# Patient Record
Sex: Female | Born: 1980 | Hispanic: Yes | Marital: Married | Smoking: Never smoker
Health system: Southern US, Community
[De-identification: ages and names within clinical notes are randomized; demographics above are authoritative.]

## PROBLEM LIST (undated history)

## (undated) DIAGNOSIS — R7303 Prediabetes: Secondary | ICD-10-CM

## (undated) HISTORY — PX: TUBAL LIGATION: SHX77

---

## 2007-02-15 ENCOUNTER — Emergency Department: Payer: Self-pay

## 2007-02-17 ENCOUNTER — Ambulatory Visit: Payer: Self-pay

## 2007-02-22 ENCOUNTER — Ambulatory Visit: Payer: Self-pay

## 2014-01-24 ENCOUNTER — Ambulatory Visit: Payer: Self-pay | Admitting: Advanced Practice Midwife

## 2014-04-16 ENCOUNTER — Ambulatory Visit: Payer: Self-pay | Admitting: Advanced Practice Midwife

## 2018-02-17 DIAGNOSIS — R51 Headache: Secondary | ICD-10-CM | POA: Diagnosis not present

## 2018-02-17 DIAGNOSIS — R7309 Other abnormal glucose: Secondary | ICD-10-CM | POA: Diagnosis not present

## 2018-02-17 DIAGNOSIS — R42 Dizziness and giddiness: Secondary | ICD-10-CM | POA: Diagnosis not present

## 2018-04-04 DIAGNOSIS — R7303 Prediabetes: Secondary | ICD-10-CM | POA: Diagnosis not present

## 2018-04-04 DIAGNOSIS — Z0001 Encounter for general adult medical examination with abnormal findings: Secondary | ICD-10-CM | POA: Diagnosis not present

## 2018-05-03 DIAGNOSIS — Z01419 Encounter for gynecological examination (general) (routine) without abnormal findings: Secondary | ICD-10-CM | POA: Diagnosis not present

## 2018-05-08 DIAGNOSIS — S6991XA Unspecified injury of right wrist, hand and finger(s), initial encounter: Secondary | ICD-10-CM | POA: Diagnosis not present

## 2018-05-08 DIAGNOSIS — M79644 Pain in right finger(s): Secondary | ICD-10-CM | POA: Diagnosis not present

## 2018-05-08 DIAGNOSIS — M722 Plantar fascial fibromatosis: Secondary | ICD-10-CM | POA: Diagnosis not present

## 2019-05-09 ENCOUNTER — Ambulatory Visit: Payer: Self-pay | Admitting: General Surgery

## 2019-05-09 NOTE — H&P (Signed)
PATIENT PROFILE: Donna Mcdaniel is a 38 y.o. female who presents to the Clinic for consultation at the request of Dr. Edwina Barth for evaluation of right inguinal hernia.  PCP:  Velna Ochs, MD  HISTORY OF PRESENT ILLNESS: Ms. Donna Mcdaniel reports having a right inguinal hernia.  She reports that she has had it for more than 10 years.  Initially was a small knot.  But in the last year he has been getting bigger.  She reports that it can be reduced easily.  She reports pains when the hernia is out.  Pain does not radiate to other part of body.  Pain is aggravated by walking large distances.  Alleviating factor is reducing the hernia.  Patient does not take medication.  Denies abdominal distention.  Denies nausea or vomiting.  Denies constipation.   PROBLEM LIST:         Problem List  Date Reviewed: 04/08/2019         Noted   Prediabetes 04/04/2018      GENERAL REVIEW OF SYSTEMS:   General ROS: negative for - chills, fatigue, fever, weight gain or weight loss Allergy and Immunology ROS: negative for - hives  Hematological and Lymphatic ROS: negative for - bleeding problems or bruising, negative for palpable nodes Endocrine ROS: negative for - heat or cold intolerance, hair changes Respiratory ROS: negative for - cough, shortness of breath or wheezing Cardiovascular ROS: no chest pain or palpitations GI ROS: negative for nausea, vomiting, abdominal pain, diarrhea, constipation Musculoskeletal ROS: negative for - joint swelling or muscle pain Neurological ROS: negative for - confusion, syncope Dermatological ROS: negative for pruritus and rash Psychiatric: negative for anxiety, depression, difficulty sleeping and memory loss  MEDICATIONS: Medication history reviewed and patient reports she does not take any medications.  ALLERGIES: Patient has no known allergies.  PAST MEDICAL HISTORY: History reviewed. No pertinent past medical history.  PAST SURGICAL HISTORY:      Past Surgical History:  Procedure Laterality Date  . TUBAL LIGATION  2015     FAMILY HISTORY:      Family History  Problem Relation Age of Onset  . No Known Problems Mother   . Heart disease Father   . High blood pressure (Hypertension) Father      SOCIAL HISTORY: Social History          Socioeconomic History  . Marital status: Married    Spouse name: Raynelle Chary  . Number of children: 3  . Years of education: Not on file  . Highest education level: Not on file  Occupational History  . Occupation: Merchant navy officer  Social Needs  . Financial resource strain: Not on file  . Food insecurity:    Worry: Not on file    Inability: Not on file  . Transportation needs:    Medical: Not on file    Non-medical: Not on file  Tobacco Use  . Smoking status: Never Smoker  . Smokeless tobacco: Never Used  Substance and Sexual Activity  . Alcohol use: Not Currently  . Drug use: Not Currently  . Sexual activity: Yes    Partners: Male    Birth control/protection: Surgical  Other Topics Concern  . Not on file  Social History Narrative  . Not on file      PHYSICAL EXAM:    Vitals:   05/09/19 1007  BP: 100/62  Pulse: 74   Body mass index is 25.15 kg/m. Weight: 64.4 kg (142 lb)   GENERAL: Alert, active, oriented x3  HEENT:  Pupils equal reactive to light. Extraocular movements are intact. Sclera clear. Palpebral conjunctiva normal red color.Pharynx clear.  NECK: Supple with no palpable mass and no adenopathy.  LUNGS: Sound clear with no rales rhonchi or wheezes.  HEART: Regular rhythm S1 and S2 without murmur.  ABDOMEN: Soft and depressible, nontender with no palpable mass, no hepatomegaly.  Right groin hernia, reducible, minimally tender upon reduction.  EXTREMITIES: Well-developed well-nourished symmetrical with no dependent edema.  NEUROLOGICAL: Awake alert oriented, facial expression symmetrical, moving all extremities.  REVIEW OF  DATA: I have reviewed the following data today:      Appointment on 04/01/2019  Component Date Value  . Glucose 04/01/2019 91   . Sodium 04/01/2019 138   . Potassium 04/01/2019 4.3   . Chloride 04/01/2019 103   . Carbon Dioxide (CO2) 04/01/2019 30.0   . Urea Nitrogen (BUN) 04/01/2019 13   . Creatinine 04/01/2019 0.7   . Glomerular Filtration Ra* 04/01/2019 94   . Calcium 04/01/2019 9.4   . AST  04/01/2019 72*  . ALT  04/01/2019 37   . Alk Phos (alkaline Phosp* 04/01/2019 57   . Albumin 04/01/2019 4.3   . Bilirubin, Total 04/01/2019 0.5   . Protein, Total 04/01/2019 6.8   . A/G Ratio 04/01/2019 1.7   . WBC (White Blood Cell Co* 04/01/2019 6.4   . RBC (Red Blood Cell Coun* 04/01/2019 4.55   . Hemoglobin 04/01/2019 14.1   . Hematocrit 04/01/2019 41.3   . MCV (Mean Corpuscular Vo* 04/01/2019 90.8   . MCH (Mean Corpuscular He* 04/01/2019 31.0   . MCHC (Mean Corpuscular H* 04/01/2019 34.1   . Platelet Count 04/01/2019 194   . RDW-CV (Red Cell Distrib* 04/01/2019 13.1   . MPV (Mean Platelet Volum* 04/01/2019 12.4   . Neutrophils 04/01/2019 3.56   . Lymphocytes 04/01/2019 2.20   . Monocytes 04/01/2019 0.49   . Eosinophils 04/01/2019 0.08   . Basophils 04/01/2019 0.03   . Neutrophil % 04/01/2019 55.8   . Lymphocyte % 04/01/2019 34.5   . Monocyte % 04/01/2019 7.7   . Eosinophil % 04/01/2019 1.3   . Basophil% 04/01/2019 0.5   . Immature Granulocyte % 04/01/2019 0.2   . Immature Granulocyte Cou* 04/01/2019 0.01   . Hemoglobin A1C 04/01/2019 5.6   . Average Blood Glucose (C* 04/01/2019 114   . Color 04/01/2019 Yellow   . Clarity 04/01/2019 Clear   . Specific Gravity 04/01/2019 1.015   . pH, Urine 04/01/2019 8.0   . Protein, Urinalysis 04/01/2019 Negative   . Glucose, Urinalysis 04/01/2019 Negative   . Ketones, Urinalysis 04/01/2019 Negative   . Blood, Urinalysis 04/01/2019 Small*  . Nitrite, Urinalysis 04/01/2019 Negative   . Leukocyte Esterase, Urin* 04/01/2019 Trace*  .  White Blood Cells, Urina* 04/01/2019 0-3   . Red Blood Cells, Urinaly* 04/01/2019 0-3   . Bacteria, Urinalysis 04/01/2019 Rare*  . Squamous Epithelial Cell* 04/01/2019 Few      ASSESSMENT: Ms. Donna Mcdaniel is a 38 y.o. female presenting for consultation for right inguinal hernia.    The patient presents with a symptomatic, reducible inguinal hernia. Patient was oriented about the diagnosis of inguinal hernia and its implication. The patient was oriented about the treatment alternatives (observation vs surgical repair). Due to patient symptoms, repair is recommended. Patient oriented about the surgical procedure, the use of mesh and its risk of complications such as: infection, bleeding, injury inguinal canal structures, vasculature, injury to bowel or bladder, and chronic pain.  Non-recurrent unilateral inguinal hernia without  obstruction or gangrene [K40.90]  PLAN: 1. Laparoscopic right inguinal hernia repair with mesh (50354) 2. CBC, CMP 3. Avoid taking aspirin 5 days before surgery 4. Contact us if has any question or concern  Patient verbalized understanding, all questions were answered, and were agreeable with the plan outlined above.    Herbert Pun, MD  Electronically signed by Herbert Pun, MD

## 2019-05-09 NOTE — H&P (View-Only) (Signed)
PATIENT PROFILE: Donna Mcdaniel is a 38 y.o. female who presents to the Clinic for consultation at the request of Dr. Edwina Barth for evaluation of right inguinal hernia.  PCP:  Velna Ochs, MD  HISTORY OF PRESENT ILLNESS: Ms. Donna Mcdaniel reports having a right inguinal hernia.  She reports that she has had it for more than 10 years.  Initially was a small knot.  But in the last year he has been getting bigger.  She reports that it can be reduced easily.  She reports pains when the hernia is out.  Pain does not radiate to other part of body.  Pain is aggravated by walking large distances.  Alleviating factor is reducing the hernia.  Patient does not take medication.  Denies abdominal distention.  Denies nausea or vomiting.  Denies constipation.   PROBLEM LIST:         Problem List  Date Reviewed: 04/08/2019         Noted   Prediabetes 04/04/2018      GENERAL REVIEW OF SYSTEMS:   General ROS: negative for - chills, fatigue, fever, weight gain or weight loss Allergy and Immunology ROS: negative for - hives  Hematological and Lymphatic ROS: negative for - bleeding problems or bruising, negative for palpable nodes Endocrine ROS: negative for - heat or cold intolerance, hair changes Respiratory ROS: negative for - cough, shortness of breath or wheezing Cardiovascular ROS: no chest pain or palpitations GI ROS: negative for nausea, vomiting, abdominal pain, diarrhea, constipation Musculoskeletal ROS: negative for - joint swelling or muscle pain Neurological ROS: negative for - confusion, syncope Dermatological ROS: negative for pruritus and rash Psychiatric: negative for anxiety, depression, difficulty sleeping and memory loss  MEDICATIONS: Medication history reviewed and patient reports she does not take any medications.  ALLERGIES: Patient has no known allergies.  PAST MEDICAL HISTORY: History reviewed. No pertinent past medical history.  PAST SURGICAL HISTORY:      Past Surgical History:  Procedure Laterality Date  . TUBAL LIGATION  2015     FAMILY HISTORY:      Family History  Problem Relation Age of Onset  . No Known Problems Mother   . Heart disease Father   . High blood pressure (Hypertension) Father      SOCIAL HISTORY: Social History          Socioeconomic History  . Marital status: Married    Spouse name: Raynelle Chary  . Number of children: 3  . Years of education: Not on file  . Highest education level: Not on file  Occupational History  . Occupation: Merchant navy officer  Social Needs  . Financial resource strain: Not on file  . Food insecurity:    Worry: Not on file    Inability: Not on file  . Transportation needs:    Medical: Not on file    Non-medical: Not on file  Tobacco Use  . Smoking status: Never Smoker  . Smokeless tobacco: Never Used  Substance and Sexual Activity  . Alcohol use: Not Currently  . Drug use: Not Currently  . Sexual activity: Yes    Partners: Male    Birth control/protection: Surgical  Other Topics Concern  . Not on file  Social History Narrative  . Not on file      PHYSICAL EXAM:    Vitals:   05/09/19 1007  BP: 100/62  Pulse: 74   Body mass index is 25.15 kg/m. Weight: 64.4 kg (142 lb)   GENERAL: Alert, active, oriented x3  HEENT:  Pupils equal reactive to light. Extraocular movements are intact. Sclera clear. Palpebral conjunctiva normal red color.Pharynx clear.  NECK: Supple with no palpable mass and no adenopathy.  LUNGS: Sound clear with no rales rhonchi or wheezes.  HEART: Regular rhythm S1 and S2 without murmur.  ABDOMEN: Soft and depressible, nontender with no palpable mass, no hepatomegaly.  Right groin hernia, reducible, minimally tender upon reduction.  EXTREMITIES: Well-developed well-nourished symmetrical with no dependent edema.  NEUROLOGICAL: Awake alert oriented, facial expression symmetrical, moving all extremities.  REVIEW OF  DATA: I have reviewed the following data today:      Appointment on 04/01/2019  Component Date Value  . Glucose 04/01/2019 91   . Sodium 04/01/2019 138   . Potassium 04/01/2019 4.3   . Chloride 04/01/2019 103   . Carbon Dioxide (CO2) 04/01/2019 30.0   . Urea Nitrogen (BUN) 04/01/2019 13   . Creatinine 04/01/2019 0.7   . Glomerular Filtration Ra* 04/01/2019 94   . Calcium 04/01/2019 9.4   . AST  04/01/2019 72*  . ALT  04/01/2019 37   . Alk Phos (alkaline Phosp* 04/01/2019 57   . Albumin 04/01/2019 4.3   . Bilirubin, Total 04/01/2019 0.5   . Protein, Total 04/01/2019 6.8   . A/G Ratio 04/01/2019 1.7   . WBC (White Blood Cell Co* 04/01/2019 6.4   . RBC (Red Blood Cell Coun* 04/01/2019 4.55   . Hemoglobin 04/01/2019 14.1   . Hematocrit 04/01/2019 41.3   . MCV (Mean Corpuscular Vo* 04/01/2019 90.8   . MCH (Mean Corpuscular He* 04/01/2019 31.0   . MCHC (Mean Corpuscular H* 04/01/2019 34.1   . Platelet Count 04/01/2019 194   . RDW-CV (Red Cell Distrib* 04/01/2019 13.1   . MPV (Mean Platelet Volum* 04/01/2019 12.4   . Neutrophils 04/01/2019 3.56   . Lymphocytes 04/01/2019 2.20   . Monocytes 04/01/2019 0.49   . Eosinophils 04/01/2019 0.08   . Basophils 04/01/2019 0.03   . Neutrophil % 04/01/2019 55.8   . Lymphocyte % 04/01/2019 34.5   . Monocyte % 04/01/2019 7.7   . Eosinophil % 04/01/2019 1.3   . Basophil% 04/01/2019 0.5   . Immature Granulocyte % 04/01/2019 0.2   . Immature Granulocyte Cou* 04/01/2019 0.01   . Hemoglobin A1C 04/01/2019 5.6   . Average Blood Glucose (C* 04/01/2019 114   . Color 04/01/2019 Yellow   . Clarity 04/01/2019 Clear   . Specific Gravity 04/01/2019 1.015   . pH, Urine 04/01/2019 8.0   . Protein, Urinalysis 04/01/2019 Negative   . Glucose, Urinalysis 04/01/2019 Negative   . Ketones, Urinalysis 04/01/2019 Negative   . Blood, Urinalysis 04/01/2019 Small*  . Nitrite, Urinalysis 04/01/2019 Negative   . Leukocyte Esterase, Urin* 04/01/2019 Trace*  .  White Blood Cells, Urina* 04/01/2019 0-3   . Red Blood Cells, Urinaly* 04/01/2019 0-3   . Bacteria, Urinalysis 04/01/2019 Rare*  . Squamous Epithelial Cell* 04/01/2019 Few      ASSESSMENT: Ms. Donna Mcdaniel is a 38 y.o. female presenting for consultation for right inguinal hernia.    The patient presents with a symptomatic, reducible inguinal hernia. Patient was oriented about the diagnosis of inguinal hernia and its implication. The patient was oriented about the treatment alternatives (observation vs surgical repair). Due to patient symptoms, repair is recommended. Patient oriented about the surgical procedure, the use of mesh and its risk of complications such as: infection, bleeding, injury inguinal canal structures, vasculature, injury to bowel or bladder, and chronic pain.  Non-recurrent unilateral inguinal hernia without   obstruction or gangrene [K40.90]  PLAN: 1. Laparoscopic right inguinal hernia repair with mesh (50354) 2. CBC, CMP 3. Avoid taking aspirin 5 days before surgery 4. Contact us if has any question or concern  Patient verbalized understanding, all questions were answered, and were agreeable with the plan outlined above.    Herbert Pun, MD  Electronically signed by Herbert Pun, MD

## 2019-05-20 ENCOUNTER — Encounter
Admission: RE | Admit: 2019-05-20 | Discharge: 2019-05-20 | Disposition: A | Discharge status: Home / Self Care | Payer: 59 | Source: Ambulatory Visit | Attending: General Surgery | Admitting: General Surgery

## 2019-05-20 ENCOUNTER — Other Ambulatory Visit: Payer: Self-pay

## 2019-05-20 HISTORY — DX: Prediabetes: R73.03

## 2019-05-20 NOTE — Patient Instructions (Signed)
Your procedure is scheduled on: 05-24-19 FRIDAY Su procedimiento est programado para: Report to [facility] 2ND FLOOR Presntese a: To find out your arrival time please call [phone] between 1PM - 3PM on 05-23-19 THURSDAY Para saber su hora de llegada por favor llame al [phone] entre la 1PM - 3PM el da:   Remember: Instructions that are not followed completely may result in serious medical risk, up to and including death,  or upon the discretion of your surgeon and anesthesiologist your surgery may need to be rescheduled.  Recuerde: Las instrucciones que no se siguen completamente Armed forces logistics/support/administrative officerpueden resultar en un riesgo de salud grave, incluyendo hasta  la [place]te o a discrecin de su cirujano y Scientific laboratory techniciananestesilogo, su ciruga se puede posponer.   __X_ 1.Do not eat food after midnight the night before your procedure. No    gum chewing or hard candies. You may drink clear liquids up to 2 hours     before you are scheduled to arrive for your surgery- DO not drink clear     Liquids within 2 hours of the start of your surgery.     Clear Liquids include:    water, apple juice without pulp, clear carbohydrate drink such as    Clearfast of Gartorade, Black Coffee or Tea (Do not add anything to coffee or tea).      No coma nada despus de la medianoche de la noche anterior a su    procedimiento. No coma chicles ni caramelos duros. Puede tomar    lquidos claros hasta 2 horas antes de su hora programada de llegada al     hospital para su procedimiento. No tome lquidos claros durante el     transcurso de las 2 horas de su llegada programada al hospital para su     procedimiento, ya que esto puede llevar a que su procedimiento se    retrase o tenga que volver a Magazine features editorprogramarse.  Los lquidos claros incluyen:          - Agua o jugo de Font[place]amanzana sin pulpa          - Bebidas claras con carbohidratos como ClearFast o Gatorade          - Caf negro o t claro (sin leche, sin cremas, no agregue  nada al caf ni al t)  No tome nada que no est en esta lista.  Los pacientes con diabetes tipo 1 y tipo 2 solo deben Printmakertomar agua.  Llame a la clnica de PreCare o a la unidad de Same Day Surgery si  tiene alguna pregunta sobre estas instrucciones.              ___ 2.Do Not Smoke or use e-cigarettes For 24 Hours Prior to Your Surgery.    Do not use any chewable tobacco products for at least 6   hours prior to surgery.    No fume ni use cigarrillos electrnicos durante las 24 horas previas    a su [place].  No use ningn producto de tabaco masticable durante   al menos 6 horas antes de la [place].     __X_ 3. No alcohol for 24 hours before or after surgery.    No tome alcohol durante las 24 horas antes ni despus de la [place].   ____4. Bring all medications with you on the day of surgery if instructed.    Lleve todos los medicamentos con usted el da de su ciruga si se le  ha indicado as.   _X___ 5. Notify your doctor if there is any change in your medical condition (cold,fever, infections).    Informe a su mdico si hay algn cambio en su condicin mdica  (resfriado, fiebre, infecciones).   Do not wear jewelry, make-up, hairpins, clips or nail polish.  No use joyas, maquillajes, pinzas/ganchos para el cabello ni esmalte de uas.  Do not wear lotions, powders, or perfumes. You may wear deodorant.  No use lociones, polvos o perfumes.  Puede usar desodorante.    Do not shave 48 hours prior to surgery. Men may shave face and neck.  No se afeite 48 horas antes de la ciruga.  Los hombres pueden Commercial Metals Companyafeitarse la cara  y el cuello.   Do not bring valuables to the hospital.   No lleve objetos de valor al [place]hospital.  [facility] any belongings or valuables.  [place] no se hace responsable de ningn tipo de pertenencias u objetos de Licensed conveyancervalor.               Contacts, dentures or bridgework may not be worn into surgery.  Los lentes de [place]contacto, las dentaduras  postizas o puentes no se pueden usar en la [place].   Leave your suitcase in the car. After surgery it may be brought to your room.  Deje su maleta en el auto.  Despus de la ciruga podr traerla a su habitacin.   For patients admitted to the hospital, discharge time is determined by your  treatment team.  Para los pacientes que sean ingresados al hospital, el tiempo en el cual se le  dar de alta es determinado por su equipo de [place].   Patients discharged the day of surgery will not be allowed to drive home. A los pacientes que se les da de alta el mismo da de la ciruga no se les permitir conducir a Higher education careers advisercasa.   Please read over the following fact sheets that you were given: Por favor lea las siguientes hojas de informacin que le dieron:     ____ Take these medicines the morning of surgery with A SIP OF WATER:          [place]ens-Illinoisome estas medicinas la maana de la ciruga con UN SORBO DE AGUA:  1.NONE  2.   3.   4.       5.  6.  ____ Fleet Enema (as directed)          Enema de Fleet (segn lo indicado)    _X___ Use CHG Soap as directed          Utilice el jabn de CHG segn lo indicado  ____ Use inhalers on the day of surgery          Use los inhaladores el da de la ciruga  ____ Stop metformin 2 days prior to surgery          Deje de tomar el metformin 2 das antes de la ciruga    ____ Take 1/2 of usual insulin dose the night before surgery and none on the morning of surgery           Tome la mitad de la dosis habitual de insulina la noche antes de la [place] y no tome nada en la maana de la             ciruga  ____ Stop Coumadin/Plavix/aspirin on           Deje de tomar el Coumadin/Plavix/aspirina  el da:  _X___ Stop Anti-inflammatories NOW-DO NOT TAKE ADVIL, IBUPROFEN, ALEVE  NAPROXEN OR ANYTHING WITH ASPIRIN IN IT. OK TO TAKE TYLENOL IF  NEEDED          Deje de tomar antiinflamatorios el da:   ____ Stop supplements until after surgery            Deje de tomar  suplementos hasta despus de la ciruga  ____ Bring C-Pap to the hospital          [place] al hospital

## 2019-05-21 ENCOUNTER — Other Ambulatory Visit
Admission: RE | Admit: 2019-05-21 | Discharge: 2019-05-21 | Disposition: A | Discharge status: Home / Self Care | Payer: 59 | Source: Ambulatory Visit | Attending: General Surgery | Admitting: General Surgery

## 2019-05-21 ENCOUNTER — Other Ambulatory Visit: Payer: Self-pay

## 2019-05-21 DIAGNOSIS — Z20828 Contact with and (suspected) exposure to other viral communicable diseases: Secondary | ICD-10-CM | POA: Insufficient documentation

## 2019-05-21 DIAGNOSIS — Z01812 Encounter for preprocedural laboratory examination: Secondary | ICD-10-CM | POA: Diagnosis present

## 2019-05-21 LAB — SARS CORONAVIRUS 2 (TAT 6-24 HRS): SARS Coronavirus 2: NEGATIVE

## 2019-05-23 ENCOUNTER — Other Ambulatory Visit: Payer: Self-pay

## 2019-05-24 ENCOUNTER — Ambulatory Visit: Payer: No Typology Code available for payment source | Admitting: Certified Registered"

## 2019-05-24 ENCOUNTER — Ambulatory Visit
Admission: RE | Admit: 2019-05-24 | Discharge: 2019-05-24 | Disposition: A | Discharge status: Home / Self Care | Payer: No Typology Code available for payment source | Attending: General Surgery | Admitting: General Surgery

## 2019-05-24 ENCOUNTER — Encounter: Payer: Self-pay | Admitting: *Deleted

## 2019-05-24 ENCOUNTER — Encounter
Admission: RE | Disposition: A | Discharge status: Home / Self Care | Payer: Self-pay | Source: Home / Self Care | Attending: General Surgery

## 2019-05-24 ENCOUNTER — Other Ambulatory Visit: Payer: Self-pay

## 2019-05-24 DIAGNOSIS — R7303 Prediabetes: Secondary | ICD-10-CM | POA: Insufficient documentation

## 2019-05-24 DIAGNOSIS — K4090 Unilateral inguinal hernia, without obstruction or gangrene, not specified as recurrent: Secondary | ICD-10-CM | POA: Insufficient documentation

## 2019-05-24 HISTORY — PX: INGUINAL HERNIA REPAIR: SHX194

## 2019-05-24 LAB — GLUCOSE, CAPILLARY
Glucose-Capillary: 89 mg/dL (ref 70–99)
Glucose-Capillary: 107 mg/dL — ABNORMAL HIGH (ref 70–99)

## 2019-05-24 LAB — POCT PREGNANCY, URINE: Preg Test, Ur: NEGATIVE

## 2019-05-24 SURGERY — REPAIR, HERNIA, INGUINAL, LAPAROSCOPIC
Anesthesia: General | Laterality: Right

## 2019-05-24 MED ORDER — PHENYLEPHRINE HCL (PRESSORS) 10 MG/ML IV SOLN
INTRAVENOUS | Status: DC | PRN
  Administered 2019-05-24: 100 ug via INTRAVENOUS
  Administered 2019-05-24: 200 ug via INTRAVENOUS
  Administered 2019-05-24 (×3): 100 ug via INTRAVENOUS

## 2019-05-24 MED ORDER — PROPOFOL 10 MG/ML IV BOLUS
INTRAVENOUS | Status: DC | PRN
  Administered 2019-05-24: 50 mg via INTRAVENOUS
  Administered 2019-05-24: 150 mg via INTRAVENOUS

## 2019-05-24 MED ORDER — LACTATED RINGERS IV SOLN
INTRAVENOUS | Status: DC
  Administered 2019-05-24 (×2): via INTRAVENOUS

## 2019-05-24 MED ORDER — MIDAZOLAM HCL 2 MG/2ML IJ SOLN
INTRAMUSCULAR | Status: AC
  Filled 2019-05-24: qty 2

## 2019-05-24 MED ORDER — KETAMINE HCL 50 MG/ML IJ SOLN
INTRAMUSCULAR | Status: DC | PRN
  Administered 2019-05-24: 25 mg via INTRAVENOUS

## 2019-05-24 MED ORDER — HYDROCODONE-ACETAMINOPHEN 5-325 MG PO TABS: 1.0000 | ORAL_TABLET | ORAL | 0 refills | Status: AC | PRN

## 2019-05-24 MED ORDER — SUCCINYLCHOLINE CHLORIDE 20 MG/ML IJ SOLN
INTRAMUSCULAR | Status: AC
  Filled 2019-05-24: qty 1

## 2019-05-24 MED ORDER — SUCCINYLCHOLINE CHLORIDE 20 MG/ML IJ SOLN
INTRAMUSCULAR | Status: DC | PRN
  Administered 2019-05-24: 100 mg via INTRAVENOUS

## 2019-05-24 MED ORDER — ONDANSETRON HCL 4 MG/2ML IJ SOLN
INTRAMUSCULAR | Status: AC
  Filled 2019-05-24: qty 2

## 2019-05-24 MED ORDER — DEXAMETHASONE SODIUM PHOSPHATE 10 MG/ML IJ SOLN
INTRAMUSCULAR | Status: DC | PRN
  Administered 2019-05-24: 10 mg via INTRAVENOUS

## 2019-05-24 MED ORDER — GLYCOPYRROLATE 0.2 MG/ML IJ SOLN
INTRAMUSCULAR | Status: AC
  Filled 2019-05-24: qty 1

## 2019-05-24 MED ORDER — FENTANYL CITRATE (PF) 100 MCG/2ML IJ SOLN
INTRAMUSCULAR | Status: AC
  Administered 2019-05-24: 12:00:00 25 ug via INTRAVENOUS
  Filled 2019-05-24: qty 2

## 2019-05-24 MED ORDER — SUGAMMADEX SODIUM 200 MG/2ML IV SOLN
INTRAVENOUS | Status: DC | PRN
  Administered 2019-05-24: 150 mg via INTRAVENOUS

## 2019-05-24 MED ORDER — LIDOCAINE HCL (PF) 2 % IJ SOLN
INTRAMUSCULAR | Status: AC
  Filled 2019-05-24: qty 10

## 2019-05-24 MED ORDER — CEFAZOLIN SODIUM-DEXTROSE 2-4 GM/100ML-% IV SOLN
INTRAVENOUS | Status: AC
  Filled 2019-05-24: qty 100

## 2019-05-24 MED ORDER — ONDANSETRON HCL 4 MG/2ML IJ SOLN
INTRAMUSCULAR | Status: DC | PRN
  Administered 2019-05-24: 4 mg via INTRAVENOUS

## 2019-05-24 MED ORDER — LIDOCAINE HCL (CARDIAC) PF 100 MG/5ML IV SOSY
PREFILLED_SYRINGE | INTRAVENOUS | Status: DC | PRN
  Administered 2019-05-24: 50 mg via INTRAVENOUS

## 2019-05-24 MED ORDER — DEXAMETHASONE SODIUM PHOSPHATE 10 MG/ML IJ SOLN
INTRAMUSCULAR | Status: AC
  Filled 2019-05-24: qty 1

## 2019-05-24 MED ORDER — FENTANYL CITRATE (PF) 100 MCG/2ML IJ SOLN
INTRAMUSCULAR | Status: AC
  Filled 2019-05-24: qty 2

## 2019-05-24 MED ORDER — PROPOFOL 10 MG/ML IV BOLUS
INTRAVENOUS | Status: AC
  Filled 2019-05-24: qty 20

## 2019-05-24 MED ORDER — CEFAZOLIN SODIUM-DEXTROSE 2-4 GM/100ML-% IV SOLN
2.0000 g | INTRAVENOUS | Status: AC
  Administered 2019-05-24: 09:00:00 2 g via INTRAVENOUS

## 2019-05-24 MED ORDER — ONDANSETRON HCL 4 MG/2ML IJ SOLN: 4.0000 mg | Freq: Once | INTRAMUSCULAR | Status: DC | PRN

## 2019-05-24 MED ORDER — ROCURONIUM BROMIDE 50 MG/5ML IV SOLN
INTRAVENOUS | Status: AC
  Filled 2019-05-24: qty 1

## 2019-05-24 MED ORDER — FAMOTIDINE 20 MG PO TABS
ORAL_TABLET | ORAL | Status: AC
  Administered 2019-05-24: 09:00:00 20 mg via ORAL
  Filled 2019-05-24: qty 1

## 2019-05-24 MED ORDER — GLYCOPYRROLATE 0.2 MG/ML IJ SOLN
INTRAMUSCULAR | Status: DC | PRN
  Administered 2019-05-24: 0.2 mg via INTRAVENOUS

## 2019-05-24 MED ORDER — MIDAZOLAM HCL 2 MG/2ML IJ SOLN
INTRAMUSCULAR | Status: DC | PRN
  Administered 2019-05-24: 2 mg via INTRAVENOUS

## 2019-05-24 MED ORDER — FAMOTIDINE 20 MG PO TABS
20.0000 mg | ORAL_TABLET | Freq: Once | ORAL | Status: AC
  Administered 2019-05-24: 09:00:00 20 mg via ORAL

## 2019-05-24 MED ORDER — SUGAMMADEX SODIUM 200 MG/2ML IV SOLN
INTRAVENOUS | Status: AC
  Filled 2019-05-24: qty 2

## 2019-05-24 MED ORDER — ACETAMINOPHEN 10 MG/ML IV SOLN
INTRAVENOUS | Status: AC
  Filled 2019-05-24: qty 100

## 2019-05-24 MED ORDER — FENTANYL CITRATE (PF) 100 MCG/2ML IJ SOLN
INTRAMUSCULAR | Status: DC | PRN
  Administered 2019-05-24 (×2): 50 ug via INTRAVENOUS

## 2019-05-24 MED ORDER — BUPIVACAINE-EPINEPHRINE (PF) 0.5% -1:200000 IJ SOLN
INTRAMUSCULAR | Status: DC | PRN
  Administered 2019-05-24: 10 mL

## 2019-05-24 MED ORDER — ROCURONIUM BROMIDE 100 MG/10ML IV SOLN
INTRAVENOUS | Status: DC | PRN
  Administered 2019-05-24: 10 mg via INTRAVENOUS
  Administered 2019-05-24: 5 mg via INTRAVENOUS
  Administered 2019-05-24: 10 mg via INTRAVENOUS
  Administered 2019-05-24: 20 mg via INTRAVENOUS
  Administered 2019-05-24: 35 mg via INTRAVENOUS

## 2019-05-24 MED ORDER — ACETAMINOPHEN 10 MG/ML IV SOLN
INTRAVENOUS | Status: DC | PRN
  Administered 2019-05-24: 1000 mg via INTRAVENOUS

## 2019-05-24 MED ORDER — FENTANYL CITRATE (PF) 100 MCG/2ML IJ SOLN
25.0000 ug | INTRAMUSCULAR | Status: DC | PRN
  Administered 2019-05-24 (×2): 25 ug via INTRAVENOUS

## 2019-05-24 SURGICAL SUPPLY — 55 items
APPLICATOR COTTON TIP 6 STRL (MISCELLANEOUS) IMPLANT
APPLICATOR COTTON TIP 6IN STRL (MISCELLANEOUS) ×3
BLADE CLIPPER SURG (BLADE) ×1 IMPLANT
BLADE SURG SZ11 CARB STEEL (BLADE) ×3 IMPLANT
CANISTER SUCT 1200ML W/VALVE (MISCELLANEOUS) ×3 IMPLANT
CHLORAPREP W/TINT 26 (MISCELLANEOUS) ×3 IMPLANT
COVER WAND RF STERILE (DRAPES) ×3 IMPLANT
DERMABOND ADVANCED (GAUZE/BANDAGES/DRESSINGS) ×2
DERMABOND ADVANCED .7 DNX12 (GAUZE/BANDAGES/DRESSINGS) ×1 IMPLANT
DEVICE SECURE STRAP 25 ABSORB (INSTRUMENTS) ×3 IMPLANT
DISSECT BALLN SPACEMKR OVL PDB (BALLOONS) ×3
DISSECT BALLN SPACEMKR OVL PRO (TROCAR) ×3
DISSECTOR BALLN SPCMKR OVL PDB (BALLOONS) ×1 IMPLANT
DISSECTOR BALLN SPCMKR OVL PRO (TROCAR) IMPLANT
ELECT REM PT RETURN 9FT ADLT (ELECTROSURGICAL) ×3
ELECTRODE REM PT RTRN 9FT ADLT (ELECTROSURGICAL) ×1 IMPLANT
ENDOLOOP SUT PDS II  0 18 (SUTURE) ×2
ENDOLOOP SUT PDS II 0 18 (SUTURE) IMPLANT
GAUZE SPONGE 4X4 12PLY STRL (GAUZE/BANDAGES/DRESSINGS) ×1 IMPLANT
GLOVE BIO SURGEON STRL SZ 6.5 (GLOVE) ×2 IMPLANT
GLOVE BIO SURGEONS STRL SZ 6.5 (GLOVE) ×1
GLOVE BIOGEL PI IND STRL 6.5 (GLOVE) ×1 IMPLANT
GLOVE BIOGEL PI INDICATOR 6.5 (GLOVE) ×2
GOWN STRL REUS W/ TWL LRG LVL3 (GOWN DISPOSABLE) ×2 IMPLANT
GOWN STRL REUS W/TWL LRG LVL3 (GOWN DISPOSABLE) ×4
IRRIGATION STRYKERFLOW (MISCELLANEOUS) IMPLANT
IRRIGATOR STRYKERFLOW (MISCELLANEOUS) ×3
IV NS 1000ML (IV SOLUTION) ×2
IV NS 1000ML BAXH (IV SOLUTION) IMPLANT
JELLY LUB 2OZ STRL (MISCELLANEOUS) ×2
JELLY LUBE 2OZ STRL (MISCELLANEOUS) ×1 IMPLANT
KIT TURNOVER KIT A (KITS) ×3 IMPLANT
KITTNER LAPARASCOPIC 5X40 (MISCELLANEOUS) ×3 IMPLANT
LABEL OR SOLS (LABEL) ×3 IMPLANT
MESH 3DMAX 4X6 RT LRG (Mesh General) ×2 IMPLANT
NDL FILTER BLUNT 18X1 1/2 (NEEDLE) ×1 IMPLANT
NDL HPO THNWL 1X22GA REG BVL (NEEDLE) ×1 IMPLANT
NEEDLE FILTER BLUNT 18X 1/2SAF (NEEDLE)
NEEDLE FILTER BLUNT 18X1 1/2 (NEEDLE) IMPLANT
NEEDLE SAFETY 22GX1 (NEEDLE) ×4
NS IRRIG 500ML POUR BTL (IV SOLUTION) ×3 IMPLANT
PACK LAP CHOLECYSTECTOMY (MISCELLANEOUS) ×3 IMPLANT
PENCIL ELECTRO HAND CTR (MISCELLANEOUS) ×3 IMPLANT
SCISSORS METZENBAUM CVD 33 (INSTRUMENTS) IMPLANT
SET TUBE SMOKE EVAC HIGH FLOW (TUBING) ×3 IMPLANT
SHEARS HARMONIC ACE PLUS 36CM (ENDOMECHANICALS) IMPLANT
SUT MNCRL 4-0 (SUTURE) ×2
SUT MNCRL 4-0 27XMFL (SUTURE) ×1
SUT MNCRL AB 4-0 PS2 18 (SUTURE) ×2 IMPLANT
SUT VICRYL 0 AB UR-6 (SUTURE) ×3 IMPLANT
SUTURE MNCRL 4-0 27XMF (SUTURE) IMPLANT
SYR 5ML LL (SYRINGE) ×1 IMPLANT
TRAY FOLEY MTR SLVR 16FR STAT (SET/KITS/TRAYS/PACK) ×3 IMPLANT
TROCAR 5MM SINGLE VERSAONE (TROCAR) ×2 IMPLANT
TROCAR BALLN 10M OMST10SB SPAC (TROCAR) ×1 IMPLANT

## 2019-05-24 NOTE — Interval H&P Note (Signed)
History and Physical Interval Note:  05/24/2019 8:52 AM  Donna Mcdaniel  has presented today for surgery, with the diagnosis of K40.90 NON RECURRENT UNILATERAL INQUINAL HERNIA W/O OBSTRUCTION OR GANGRENE.  The various methods of treatment have been discussed with the patient and family. After consideration of risks, benefits and other options for treatment, the patient has consented to  Procedure(s): LAPAROSCOPIC INGUINAL HERNIA RIGHT (Right) as a surgical intervention.  The patient's history has been reviewed, patient examined, no change in status, stable for surgery.  I have reviewed the patient's chart and labs.  Right sided marked in the pre procedure room. Questions were answered to the patient's satisfaction.     Herbert Pun

## 2019-05-24 NOTE — Anesthesia Post-op Follow-up Note (Signed)
Anesthesia QCDR form completed.        

## 2019-05-24 NOTE — Anesthesia Preprocedure Evaluation (Signed)
Anesthesia Evaluation  Patient identified by MRN, date of birth, ID band Patient awake    Reviewed: Allergy & Precautions, H&P , NPO status , Patient's Chart, lab work & pertinent test results, reviewed documented beta blocker date and time   Airway Mallampati: II  TM Distance: >3 FB Neck ROM: full    Dental  (+) Teeth Intact   Pulmonary neg pulmonary ROS,    Pulmonary exam normal        Cardiovascular Exercise Tolerance: Good negative cardio ROS Normal cardiovascular exam Rhythm:regular Rate:Normal     Neuro/Psych negative neurological ROS  negative psych ROS   GI/Hepatic negative GI ROS, Neg liver ROS,   Endo/Other  negative endocrine ROS  Renal/GU negative Renal ROS  negative genitourinary   Musculoskeletal   Abdominal   Peds  Hematology negative hematology ROS (+)   Anesthesia Other Findings Past Medical History: No date: Pre-diabetes Past Surgical History: No date: TUBAL LIGATION BMI    Body Mass Index: 24.76 kg/m     Reproductive/Obstetrics negative OB ROS                             Anesthesia Physical Anesthesia Plan  ASA: II  Anesthesia Plan: General ETT   Post-op Pain Management:    Induction:   PONV Risk Score and Plan:   Airway Management Planned:   Additional Equipment:   Intra-op Plan:   Post-operative Plan:   Informed Consent: I have reviewed the patients History and Physical, chart, labs and discussed the procedure including the risks, benefits and alternatives for the proposed anesthesia with the patient or authorized representative who has indicated his/her understanding and acceptance.     Dental Advisory Given  Plan Discussed with: CRNA  Anesthesia Plan Comments:         Anesthesia Quick Evaluation

## 2019-05-24 NOTE — Anesthesia Procedure Notes (Signed)
Procedure Name: Intubation Performed by: Rolla Plate, CRNA Pre-anesthesia Checklist: Patient identified, Patient being monitored, Timeout performed, Emergency Drugs available and Suction available Patient Re-evaluated:Patient Re-evaluated prior to induction Oxygen Delivery Method: Circle system utilized Preoxygenation: Pre-oxygenation with 100% oxygen Induction Type: IV induction Ventilation: Mask ventilation without difficulty Laryngoscope Size: 2 and Miller Grade View: Grade I Tube type: Oral Tube size: 7.0 mm Number of attempts: 1 Airway Equipment and Method: Stylet Placement Confirmation: ETT inserted through vocal cords under direct vision,  positive ETCO2 and breath sounds checked- equal and bilateral Secured at: 21 cm Tube secured with: Tape Dental Injury: Teeth and Oropharynx as per pre-operative assessment

## 2019-05-24 NOTE — Op Note (Signed)
Preoperative diagnosis: Right inguinal hernia.   Postoperative diagnosis: Right inguinal hernia.  Procedure: Transabdominal preperitoneal laparoscopic (TAPP) repair of right inguinal hernia.  Anesthesia: GETA  Surgeon: Dr. Windell Moment  Assistant Surgeon: Dr. Lysle Pearl  Wound Classification: Clean  Indications:  Patient is a 38 y.o. female developed a symptomatic right inguinal hernia. Repair was indicated.  Findings: 1. Indirect Inguinal hernia identified 2. Round ligament identified and preserved 3. Bard 3D Max mesh used for repair 4. TEP attempted but spacer went into abdominal cavity.  5. Adequate hemostasis.   Description of procedure: The patient was taken to the operating room and the correct side of surgery was verified. The patient was placed supine with arms tucked at the sides. After obtaining adequate anesthesia, the patient's abdomen was prepped and draped in standard sterile fashion. A time-out was completed verifying correct patient, procedure, site, positioning, and implant(s) and/or special equipment prior to beginning this procedure.  Periumbilical cutdown was done down to anterior fascia. The anterior fascia was incised. Blunt dissection with finger was done below the anterior fascia between the rectus muscle. A spacer was inserted and insufflated under direct visualization. After spacer removed, the scope was inserted and it was found to be in the abdominal cavity. It was decided to proceed with Trans abdominal pre peritoneal repair. Two 5-mm trocars were then placed lateral to the rectus sheath under direct visualization. Both inguinal regions were inspected and the median umbilical ligament, medial umbilical ligament, and lateral umbilical fold were identified.  The peritoneum was incised with endoscopic scissors along a line 2 cm above the superior edge of the hernia defect, extending from the median umbilical ligament to the anterior superior iliac spine. The peritoneal  flap was mobilized inferiorly using blunt and sharp dissection. The inferior epigastric vessels were exposed and the pubic symphysis was identified. Cooper's ligament was dissected to its junction with the iliac vein. The dissection was continued inferiorly to the iliopubic tract, with care taken to avoid injury to the femoral branch of the genitofemoral nerve and the lateral femoral cutaneous nerve. The round ligament was parietalized. The hernia was identified and reduced by gentle traction.  The indirect hernia sac was unable to be completely mobilized from the inguinal canal.  It was decided to divide the sac and it was closed with a PDS Endoloop.  Now the peritoneum seems to be reduced into the peritoneal cavity.  A large piece of mesh was rolled longitudinally into a compact cylinder and passed through a trocar. The cylinder was placed along the inferior aspect of the working space and unrolled into place to completely cover the direct, indirect, and femoral spaces. The mesh was secured into place superiorly to the anterior abdominal wall and inferiorly and medially to Cooper's ligament with absorbable tacks. Care was taken to avoid the inferolateral triangles containing the iliac vessels and genital nerves. The peritoneal flap was closed over the mesh and secured with tacks in similar positions of safety. After ensuring adequate hemostasis, the trocars were removed and the pneumoperitoneum allowed to escape. The trocar incisions were closed using monocryl and skin adhesive dressings applied.  The patient tolerated the procedure well and was taken to the postanesthesia care unit in stable condition.   Specimen: None  Complications: None  Estimated Blood Loss: 10 mL

## 2019-05-24 NOTE — Transfer of Care (Signed)
Immediate Anesthesia Transfer of Care Note  Patient: Donna Mcdaniel  Procedure(s) Performed: LAPAROSCOPIC INGUINAL HERNIA RIGHT (Right )  Patient Location: PACU  Anesthesia Type:General  Level of Consciousness: sedated  Airway & Oxygen Therapy: Patient Spontanous Breathing and Patient connected to face mask oxygen  Post-op Assessment: Report given to RN and Post -op Vital signs reviewed and stable  Post vital signs: Reviewed and stable  Last Vitals:  Vitals Value Taken Time  BP 105/59 05/24/19 1127  Temp    Pulse 66 05/24/19 1127  Resp 18 05/24/19 1127  SpO2 100 % 05/24/19 1127  Vitals shown include unvalidated device data.  Last Pain:  Vitals:   05/24/19 1127  TempSrc:   PainSc: 0-No pain         Complications: No apparent anesthesia complications

## 2019-05-24 NOTE — Progress Notes (Signed)
Pt BP maintaining 90's/60's. Last BP 96/60. Dr. Andree Elk notified. Stated pt ok to go home with that BP.

## 2019-05-24 NOTE — Discharge Instructions (Signed)
  Diet: Resume home heart healthy regular diet.   Activity: No heavy lifting >20 pounds (children, pets, laundry, garbage) or strenuous activity until follow-up, but light activity and walking are encouraged. Do not drive or drink alcohol if taking narcotic pain medications.  Wound care: May shower with soapy water and pat dry (do not rub incisions), but no baths or submerging incision underwater until follow-up. (no swimming)   Medications: Resume all home medications. For mild to moderate pain: acetaminophen (Tylenol) or ibuprofen (if no kidney disease). Combining Tylenol with alcohol can substantially increase your risk of causing liver disease. Narcotic pain medications, if prescribed, can be used for severe pain, though may cause nausea, constipation, and drowsiness. Do not combine Tylenol and Norco within a 6 hour period as Norco contains Tylenol. If you do not need the narcotic pain medication, you do not need to fill the prescription.  Call office ([phone]) at any time if any questions, worsening pain, fevers/chills, bleeding, drainage from incision site, or other concerns.   AMBULATORY SURGERY  DISCHARGE INSTRUCTIONS   1) The drugs that you were given will stay in your system until tomorrow so for the next 24 hours you should not:  A) Drive an automobile B) Make any legal decisions C) Drink any alcoholic beverage   2) You may resume regular meals tomorrow.  Today it is better to start with liquids and gradually work up to solid foods.  You may eat anything you prefer, but it is better to start with liquids, then soup and crackers, and gradually work up to solid foods.   3) Please notify your doctor immediately if you have any unusual bleeding, trouble breathing, redness and pain at the surgery site, drainage, fever, or pain not relieved by medication.    4) Additional Instructions:        Please contact your physician with any problems or Same Day Surgery at  [phone], Monday through Friday 6 am to 4 pm, or [facility] number at [phone]. 

## 2019-05-26 ENCOUNTER — Encounter: Payer: Self-pay | Admitting: General Surgery

## 2019-05-27 NOTE — Anesthesia Postprocedure Evaluation (Signed)
Anesthesia Post Note  Patient: Donna Mcdaniel  Procedure(s) Performed: LAPAROSCOPIC INGUINAL HERNIA RIGHT (Right )  Patient location during evaluation: PACU Anesthesia Type: General Level of consciousness: awake and alert Pain management: pain level controlled Vital Signs Assessment: post-procedure vital signs reviewed and stable Respiratory status: spontaneous breathing, nonlabored ventilation, respiratory function stable and patient connected to nasal cannula oxygen Cardiovascular status: blood pressure returned to baseline and stable Postop Assessment: no apparent nausea or vomiting Anesthetic complications: no     Last Vitals:  Vitals:   05/24/19 1311 05/24/19 1317  BP: (!) 97/54 98/69  Pulse: (!) 59   Resp: 16   Temp: 36.7 C   SpO2: 100% 100%    Last Pain:  Vitals:   05/24/19 1317  TempSrc: Temporal  PainSc:                  [name]

## 2019-12-14 ENCOUNTER — Ambulatory Visit: Payer: 59 | Attending: Internal Medicine

## 2019-12-14 DIAGNOSIS — Z23 Encounter for immunization: Secondary | ICD-10-CM

## 2019-12-14 NOTE — Progress Notes (Signed)
   Covid-19 Vaccination Clinic  Name:  Donna Mcdaniel    MRN: [id number] DOB: 10-Feb-1981  12/14/2019  Ms. Donna Mcdaniel was observed post Covid-19 immunization for 15 minutes without incident. She was provided with Vaccine Information Sheet and instruction to access the V-Safe system.   Ms. Donna Mcdaniel was instructed to call 911 with any severe reactions post vaccine: Marland Kitchen Difficulty breathing  . Swelling of face and throat  . A fast heartbeat  . A bad rash all over body  . Dizziness and weakness   Immunizations Administered    Name Date Dose VIS Date Route   Pfizer COVID-19 Vaccine 12/14/2019  7:52 PM 0.3 mL 09/06/2019 Intramuscular   Manufacturer: [employer], [employer]   Lot: [id number]   NDC: [address]-2

## 2020-01-04 ENCOUNTER — Ambulatory Visit: Payer: Self-pay | Attending: Internal Medicine

## 2020-01-04 DIAGNOSIS — Z23 Encounter for immunization: Secondary | ICD-10-CM

## 2020-01-04 NOTE — Progress Notes (Addendum)
   Covid-19 Vaccination Clinic  Name:  Donna Mcdaniel    MRN: [id number] DOB: 19-Feb-1981  01/04/2020  Donna Mcdaniel was observed post Covid-19 immunization for 15 minutes without incident. She was provided with Vaccine Information Sheet and instruction to access the V-Safe system. Medical interpreter used.  Donna Mcdaniel was instructed to call 911 with any severe reactions post vaccine: Marland Kitchen Difficulty breathing  . Swelling of face and throat  . A fast heartbeat  . A bad rash all over body  . Dizziness and weakness   Immunizations Administered    Name Date Dose VIS Date Route   Pfizer COVID-19 Vaccine 01/04/2020  6:08 PM 0.3 mL 09/06/2019 Intramuscular   Manufacturer: [employer], [employer]   Lot: [id number]   NDC: [address]-8

## 2020-04-28 ENCOUNTER — Other Ambulatory Visit: Payer: Self-pay

## 2020-04-28 ENCOUNTER — Encounter: Payer: Self-pay | Admitting: *Deleted

## 2020-04-28 ENCOUNTER — Emergency Department
Admission: EM | Admit: 2020-04-28 | Discharge: 2020-04-28 | Disposition: A | Discharge status: Home / Self Care | Payer: No Typology Code available for payment source | Attending: Emergency Medicine | Admitting: Emergency Medicine

## 2020-04-28 DIAGNOSIS — L292 Pruritus vulvae: Secondary | ICD-10-CM | POA: Diagnosis present

## 2020-04-28 DIAGNOSIS — A749 Chlamydial infection, unspecified: Secondary | ICD-10-CM | POA: Diagnosis not present

## 2020-04-28 DIAGNOSIS — Z202 Contact with and (suspected) exposure to infections with a predominantly sexual mode of transmission: Secondary | ICD-10-CM | POA: Diagnosis not present

## 2020-04-28 MED ORDER — AZITHROMYCIN 500 MG PO TABS
1000.0000 mg | ORAL_TABLET | Freq: Once | ORAL | Status: AC
  Administered 2020-04-28: 1000 mg via ORAL
  Filled 2020-04-28: qty 2

## 2020-04-28 NOTE — ED Notes (Signed)
Spanish interpreter via video Gillis Santa [phone] assisted with triage.

## 2020-04-28 NOTE — ED Triage Notes (Signed)
Patient is here to be treated for STD. Patient's husband has been tested.

## 2020-04-28 NOTE — ED Provider Notes (Signed)
[facility] Emergency Department Provider Note  ____________________________________________   First MD Initiated Contact with Patient 04/28/20 1806     (approximate)  I have reviewed the triage vital signs and the nursing notes.   HISTORY  Chief Complaint Vaginal Itching    HPI Donna Mcdaniel is a 39 y.o. female presents ED she would like to be treated.  She denies any fever, chills, or vaginal discharge.    Past Medical History:  Diagnosis Date  . Pre-diabetes     There are no problems to display for this patient.   Past Surgical History:  Procedure Laterality Date  . INGUINAL HERNIA REPAIR Right 05/24/2019   Procedure: LAPAROSCOPIC INGUINAL HERNIA RIGHT;  Surgeon: Carolan Shiver, MD;  Location: [facility] ORS;  Service: General;  Laterality: Right;  . TUBAL LIGATION      Prior to Admission medications   Not on File    Allergies Patient has no known allergies.  No family history on file.  Social History Social History   Tobacco Use  . Smoking status: Never Smoker  . Smokeless tobacco: Never Used  Vaping Use  . Vaping Use: Never used  Substance Use Topics  . Alcohol use: Never  . Drug use: Never    Review of Systems  Constitutional: No fever/chills Eyes: No visual changes. ENT: No sore throat. Respiratory: Denies cough Gastrointestinal: Denies abdominal pain Genitourinary: Negative for dysuria. Musculoskeletal: Negative for back pain. Skin: Negative for rash.    ____________________________________________   PHYSICAL EXAM:  VITAL SIGNS: ED Triage Vitals  Enc Vitals Group     BP 04/28/20 1803 (!) 131/91     Pulse Rate 04/28/20 1803 74     Resp 04/28/20 1803 16     Temp 04/28/20 1803 98.8 F (37.1 C)     Temp Source 04/28/20 1803 Oral     SpO2 04/28/20 1803 98 %     Weight 04/28/20 1806 137 lb (62.1 kg)     Height 04/28/20 1806 5\' 3"  (1.6 m)     Head Circumference --      Peak Flow --      Pain Score  04/28/20 1806 0     Pain Loc --      Pain Edu? --      Excl. in GC? --     Constitutional: Alert and oriented. Well appearing and in no acute distress. Eyes: Conjunctivae are normal.  Head: Atraumatic. Nose: No congestion/rhinnorhea. Mouth/Throat: Mucous membranes are moist.   Neck:  supple no lymphadenopathy noted Cardiovascular: Normal rate, regular rhythm. Respiratory: Normal respiratory effort.  No retractions,  GU: deferred by the patient Musculoskeletal: FROM all extremities, warm and well perfused Neurologic:  Normal speech and language.  Skin:  Skin is warm, dry and intact. No rash noted. Psychiatric: Mood and affect are normal. Speech and behavior are normal.  ____________________________________________   LABS (all labs ordered are listed, but only abnormal results are displayed)  Labs Reviewed - No data to display ____________________________________________   ____________________________________________  RADIOLOGY    ____________________________________________   PROCEDURES  Procedure(s) performed: No  Procedures    ____________________________________________   INITIAL IMPRESSION / ASSESSMENT AND PLAN / ED COURSE  Pertinent labs & imaging results that were available during my care of the patient were reviewed by me and considered in my medical decision making (see chart for details).   Patient is a 39 year old female presents emergency department with her husband.  Husband just tested positive for chlamydia while here  in my care.  Feel that it would be appropriate to go ahead and treat her.  She is denying any other symptoms.  Physical exam is unremarkable.  Pelvic was deferred by the patient  Patient was treated for chlamydia as her husband just tested positive for chlamydia.  Safe sex instructions were given.  They are to refrain from sex for 10 days.  Follow-up with the health department as needed.  Return if worsening.     Donna Mcdaniel was evaluated in Emergency Department on 04/28/2020 for the symptoms described in the history of present illness. She was evaluated in the context of the global COVID-19 pandemic, which necessitated consideration that the patient might be at risk for infection with the SARS-CoV-2 virus that causes COVID-19. Institutional protocols and algorithms that pertain to the evaluation of patients at risk for COVID-19 are in a state of rapid change based on information released by regulatory bodies including the CDC and federal and state organizations. These policies and algorithms were followed during the patient's care in the ED.    As part of my medical decision making, I reviewed the following data within the electronic MEDICAL RECORD NUMBER Nursing notes reviewed and incorporated, Old chart reviewed, Notes from prior ED visits and [place] Controlled Substance Database  ____________________________________________   FINAL CLINICAL IMPRESSION(S) / ED DIAGNOSES  Final diagnoses:  Exposure to STD  Chlamydia      NEW MEDICATIONS STARTED DURING THIS VISIT:  New Prescriptions   No medications on file     Note:  This document was prepared using Dragon voice recognition software and may include unintentional dictation errors.    Faythe Ghee, PA-C 04/28/20 Nancy Fetter, MD 04/28/20 2109

## 2020-04-28 NOTE — Discharge Instructions (Signed)
Follow-up with your regular doctor or the [facility] department You have been treated for chlamydia Do not have sex for at least 10 days Use condoms to prevent STDs

## 2020-12-08 ENCOUNTER — Emergency Department

## 2020-12-08 ENCOUNTER — Encounter: Payer: Self-pay | Admitting: Emergency Medicine

## 2020-12-08 ENCOUNTER — Emergency Department
Admission: EM | Admit: 2020-12-08 | Discharge: 2020-12-08 | Disposition: A | Discharge status: Home / Self Care | Attending: Emergency Medicine | Admitting: Emergency Medicine

## 2020-12-08 ENCOUNTER — Other Ambulatory Visit: Payer: Self-pay

## 2020-12-08 DIAGNOSIS — Y9263 Factory as the place of occurrence of the external cause: Secondary | ICD-10-CM | POA: Insufficient documentation

## 2020-12-08 DIAGNOSIS — T23252A Burn of second degree of left palm, initial encounter: Secondary | ICD-10-CM | POA: Diagnosis not present

## 2020-12-08 DIAGNOSIS — X19XXXA Contact with other heat and hot substances, initial encounter: Secondary | ICD-10-CM | POA: Insufficient documentation

## 2020-12-08 DIAGNOSIS — Y990 Civilian activity done for income or pay: Secondary | ICD-10-CM | POA: Diagnosis not present

## 2020-12-08 DIAGNOSIS — T23052A Burn of unspecified degree of left palm, initial encounter: Secondary | ICD-10-CM | POA: Diagnosis present

## 2020-12-08 MED ORDER — LIDOCAINE 3 % EX CREA: 1.0000 "application " | TOPICAL_CREAM | Freq: Two times a day (BID) | CUTANEOUS | 0 refills | Status: AC | PRN

## 2020-12-08 MED ORDER — SILVER SULFADIAZINE 1 % EX CREA: TOPICAL_CREAM | CUTANEOUS | 0 refills | Status: AC

## 2020-12-08 MED ORDER — SILVER SULFADIAZINE 1 % EX CREA
TOPICAL_CREAM | Freq: Once | CUTANEOUS | Status: AC
  Administered 2020-12-08: 1 via TOPICAL

## 2020-12-08 MED ORDER — FENTANYL CITRATE (PF) 100 MCG/2ML IJ SOLN
100.0000 ug | Freq: Once | INTRAMUSCULAR | Status: AC
  Administered 2020-12-08: 100 ug via INTRAMUSCULAR
  Filled 2020-12-08: qty 2

## 2020-12-08 MED ORDER — ONDANSETRON 8 MG PO TBDP
8.0000 mg | ORAL_TABLET | Freq: Once | ORAL | Status: AC
  Administered 2020-12-08: 8 mg via ORAL
  Filled 2020-12-08: qty 1

## 2020-12-08 MED ORDER — LIDOCAINE HCL URETHRAL/MUCOSAL 2 % EX GEL
1.0000 "application " | Freq: Once | CUTANEOUS | Status: AC
  Administered 2020-12-08: 1 via TOPICAL
  Filled 2020-12-08: qty 5

## 2020-12-08 MED ORDER — OXYCODONE-ACETAMINOPHEN 5-325 MG PO TABS: 1.0000 | ORAL_TABLET | Freq: Four times a day (QID) | ORAL | 0 refills | Status: AC | PRN

## 2020-12-08 NOTE — ED Notes (Signed)
See triage note.. presents with plastic to left hand  States she wa strying to get a scew from a machine

## 2020-12-08 NOTE — ED Provider Notes (Signed)
[facility] Emergency Department Provider Note  ____________________________________________  Time seen: Approximately 5:06 PM  I have reviewed the triage vital signs and the nursing notes.   HISTORY  Chief Complaint Burn    HPI Donna Mcdaniel is a 40 y.o. female who presents the emergency department complaining of a burn to the left hand.  Patient works at [employer] and was changing out the bolt when one of the machines.  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  Patient was changing the mold when the machine started to dispense molten plastic.  Patient had turned down the pressure and so this was not a significantly pressurized injury.  Patient had black mold and plastic here to her hand.  She states that she has been "picking" at the areas to remove the plastic.  Patient has a reported large vesicle to the palm of the hand.  No other injuries or complaints.         Past Medical History:  Diagnosis Date  . Pre-diabetes     There are no problems to display for this patient.   Past Surgical History:  Procedure Laterality Date  . INGUINAL HERNIA REPAIR Right 05/24/2019   Procedure: LAPAROSCOPIC INGUINAL HERNIA RIGHT;  Surgeon: Carolan Shiver, MD;  Location: [facility] ORS;  Service: General;  Laterality: Right;  . TUBAL LIGATION      Prior to Admission medications   Medication Sig Start Date End Date Taking? Authorizing Provider  Lidocaine 3 % CREA Apply 1 application topically 2 (two) times daily as needed (pain). 12/08/20  Yes Dutchess Crosland, Delorise Royals, PA-C  oxyCODONE-acetaminophen (PERCOCET/ROXICET) 5-325 MG tablet Take 1 tablet by mouth every 6 (six) hours as needed for severe pain. 12/08/20  Yes Patriece Archbold, Delorise Royals, PA-C  silver sulfADIAZINE (SILVADENE) 1 % cream Apply to affected area daily 12/08/20  Yes Cullin Dishman, Delorise Royals, PA-C    Allergies Patient has no known allergies.  No  family history on file.  Social History Social History   Tobacco Use  . Smoking status: Never Smoker  . Smokeless tobacco: Never Used  Vaping Use  . Vaping Use: Never used  Substance Use Topics  . Alcohol use: Never  . Drug use: Never     Review of Systems  Constitutional: No fever/chills Eyes: No visual changes. No discharge ENT: No upper respiratory complaints. Cardiovascular: no chest pain. Respiratory: no cough. No SOB. Gastrointestinal: No abdominal pain.  No nausea, no vomiting.  No diarrhea.  No constipation. Musculoskeletal: Burn to the left hand from molten plastic Skin: Negative for rash, abrasions, lacerations, ecchymosis. Neurological: Negative for headaches, focal weakness or numbness.  10 System ROS otherwise negative.  ____________________________________________   PHYSICAL EXAM:  VITAL SIGNS: ED Triage Vitals  Enc Vitals Group     BP 12/08/20 1552 (!) 146/101     Pulse Rate 12/08/20 1552 67     Resp 12/08/20 1552 16     Temp 12/08/20 1552 98.8 F (37.1 C)     Temp Source 12/08/20 1552 Oral     SpO2 12/08/20 1552 99 %     Weight 12/08/20 1620 136 lb 14.5 oz (62.1 kg)     Height 12/08/20 1620 5\' 3"  (1.6 m)     Head Circumference --      Peak Flow --      Pain Score --      Pain Loc --      Pain Edu? --  Excl. in GC? --      Constitutional: Alert and oriented. Well appearing and in no acute distress. Eyes: Conjunctivae are normal. PERRL. EOMI. Head: Atraumatic. ENT:      Ears:       Nose: No congestion/rhinnorhea.      Mouth/Throat: Mucous membranes are moist.  Neck: No stridor.    Cardiovascular: Normal rate, regular rhythm. Normal S1 and S2.  Good peripheral circulation. Respiratory: Normal respiratory effort without tachypnea or retractions. Lungs CTAB. Good air entry to the bases with no decreased or absent breath sounds. Musculoskeletal: Full range of motion to all extremities. No gross deformities appreciated.  Visualization of  the left hand reveals vesicles to the palm of the left hand consistent with second-degree burn.  There is no open wounds or lesions.  Vesicles intact.  Patient has no evidence of circumferential burns.  Is able to extend and flex the digits currently.  Sensation capillary refill intact.  Patient has few areas with a plastic still adhered to the external skin.  Majority of plastic has been removed by the patient prior to my assessment. Neurologic:  Normal speech and language. No gross focal neurologic deficits are appreciated.  Skin:  Skin is warm, dry and intact. No rash noted. Psychiatric: Mood and affect are normal. Speech and behavior are normal. Patient exhibits appropriate insight and judgement.   ____________________________________________   LABS (all labs ordered are listed, but only abnormal results are displayed)  Labs Reviewed - No data to display ____________________________________________  EKG   ____________________________________________  RADIOLOGY I personally viewed and evaluated these images as part of my medical decision making, as well as reviewing the written report by the radiologist.  ED Provider Interpretation: No evidence of foreign body in the soft tissues on x-ray.  DG Hand Complete Left  Result Date: 12/08/2020 CLINICAL DATA:  Burn to the palm of the left hand. EXAM: LEFT HAND - COMPLETE 3+ VIEW COMPARISON:  None. FINDINGS: There is no evidence of fracture or dislocation. There is no evidence of arthropathy or other focal bone abnormality. Soft tissues are unremarkable. IMPRESSION: Negative. Electronically Signed   By: Aram Candela M.D.   On: 12/08/2020 17:52    ____________________________________________    PROCEDURES  Procedure(s) performed:    Procedures    Medications  silver sulfADIAZINE (SILVADENE) 1 % cream (has no administration in time range)  lidocaine (XYLOCAINE) 2 % jelly 1 application (has no administration in time range)   fentaNYL (SUBLIMAZE) injection 100 mcg (100 mcg Intramuscular Given 12/08/20 1721)  ondansetron (ZOFRAN-ODT) disintegrating tablet 8 mg (8 mg Oral Given 12/08/20 1721)     ____________________________________________   INITIAL IMPRESSION / ASSESSMENT AND PLAN / ED COURSE  Pertinent labs & imaging results that were available during my care of the patient were reviewed by me and considered in my medical decision making (see chart for details).  Review of the [place] CSRS was performed in accordance of the [facility] prior to dispensing any controlled drugs.           Patient's diagnosis is consistent with same degree burn of the hand.  Patient presented to the emergency department after having molten plastic burned her left hand.  There is no penetration identified.  This was not under significant amount of pressure.  There does not appear to be any foreign body in the soft tissue of the hand.  Majority of plastic had been removed by patient prior to my assessment.  Large vesicle/bullae noted to the palm.  There is no rupture.  Sensation and capillary refill intact..  Patient will have Silvadene burn cream, topical lidocaine and prescription for pain medication.  Follow-up with [facility] burn outpatient tomorrow.  Patient is given ED precautions to return to the ED for any worsening or new symptoms.     ____________________________________________  FINAL CLINICAL IMPRESSION(S) / ED DIAGNOSES  Final diagnoses:  Partial thickness burn of palm of left hand, initial encounter      NEW MEDICATIONS STARTED DURING THIS VISIT:  ED Discharge Orders         Ordered    silver sulfADIAZINE (SILVADENE) 1 % cream        12/08/20 1826    Lidocaine 3 % CREA  2 times daily PRN        12/08/20 1826    oxyCODONE-acetaminophen (PERCOCET/ROXICET) 5-325 MG tablet  Every 6 hours PRN        12/08/20 1826              This chart was dictated using voice recognition software/Dragon. Despite best efforts to  proofread, errors can occur which can change the meaning. Any change was purely unintentional.    Racheal Patches, PA-C 12/08/20 1827    Concha Se, MD 12/09/20 [phone]

## 2020-12-08 NOTE — ED Triage Notes (Addendum)
Patient presents to the ED with a burn to her left hand from work at [employer].  Patient has black plastic melted to her left hand.  Patient states she works at [employer] and was taking off a screw and pressurized hot plastic came onto her hand.  Incident occurred approx. 1 hour ago.

## 2022-05-18 ENCOUNTER — Other Ambulatory Visit: Payer: Self-pay

## 2022-05-18 DIAGNOSIS — Z1231 Encounter for screening mammogram for malignant neoplasm of breast: Secondary | ICD-10-CM

## 2022-06-09 ENCOUNTER — Ambulatory Visit
Admission: RE | Admit: 2022-06-09 | Discharge: 2022-06-09 | Disposition: A | Discharge status: Home / Self Care | Payer: No Typology Code available for payment source | Source: Ambulatory Visit

## 2022-06-09 DIAGNOSIS — Z1231 Encounter for screening mammogram for malignant neoplasm of breast: Secondary | ICD-10-CM | POA: Insufficient documentation

## 2022-06-10 ENCOUNTER — Other Ambulatory Visit: Payer: Self-pay

## 2022-06-15 ENCOUNTER — Other Ambulatory Visit: Payer: Self-pay

## 2022-06-15 DIAGNOSIS — N6489 Other specified disorders of breast: Secondary | ICD-10-CM

## 2022-06-15 DIAGNOSIS — R928 Other abnormal and inconclusive findings on diagnostic imaging of breast: Secondary | ICD-10-CM

## 2022-07-06 ENCOUNTER — Ambulatory Visit
Admission: RE | Admit: 2022-07-06 | Discharge: 2022-07-06 | Disposition: A | Discharge status: Home / Self Care | Payer: No Typology Code available for payment source | Source: Ambulatory Visit

## 2022-07-06 DIAGNOSIS — N6489 Other specified disorders of breast: Secondary | ICD-10-CM | POA: Diagnosis present

## 2022-07-06 DIAGNOSIS — R928 Other abnormal and inconclusive findings on diagnostic imaging of breast: Secondary | ICD-10-CM | POA: Insufficient documentation

## 2022-07-07 ENCOUNTER — Ambulatory Visit (LOCAL_COMMUNITY_HEALTH_CENTER): Payer: No Typology Code available for payment source

## 2022-07-07 DIAGNOSIS — Z719 Counseling, unspecified: Secondary | ICD-10-CM

## 2022-07-07 DIAGNOSIS — Z23 Encounter for immunization: Secondary | ICD-10-CM | POA: Diagnosis not present

## 2022-07-07 NOTE — Progress Notes (Signed)
Patient provided paperwork for vaccines needed for immigration.  Rowland Lathe used for interpretation.   VIS given for vaccines. After vaccine care reviewed.  Copy of [facility] Provided.  Clayton Jarmon Shelda Pal, RN

## 2022-08-17 ENCOUNTER — Ambulatory Visit: Payer: No Typology Code available for payment source

## 2022-08-23 ENCOUNTER — Ambulatory Visit (LOCAL_COMMUNITY_HEALTH_CENTER): Payer: No Typology Code available for payment source

## 2022-08-23 ENCOUNTER — Ambulatory Visit: Payer: No Typology Code available for payment source

## 2022-08-23 DIAGNOSIS — Z23 Encounter for immunization: Secondary | ICD-10-CM

## 2022-08-23 DIAGNOSIS — Z719 Counseling, unspecified: Secondary | ICD-10-CM

## 2022-08-23 NOTE — Progress Notes (Signed)
Pt in clinic for 2nd dose of MMR and Varicella vaccines. Eligible and administered, tolerated well. M.Stephene Alegria, LPN.

## 2022-10-14 ENCOUNTER — Other Ambulatory Visit: Payer: Self-pay | Admitting: Internal Medicine

## 2022-10-14 DIAGNOSIS — R1011 Right upper quadrant pain: Secondary | ICD-10-CM

## 2022-10-14 DIAGNOSIS — R7303 Prediabetes: Secondary | ICD-10-CM

## 2022-10-21 ENCOUNTER — Ambulatory Visit
Admission: RE | Admit: 2022-10-21 | Discharge: 2022-10-21 | Disposition: A | Discharge status: Home / Self Care | Payer: No Typology Code available for payment source | Source: Ambulatory Visit | Attending: Internal Medicine | Admitting: Internal Medicine

## 2022-10-21 DIAGNOSIS — R1011 Right upper quadrant pain: Secondary | ICD-10-CM | POA: Insufficient documentation

## 2022-10-21 DIAGNOSIS — R7303 Prediabetes: Secondary | ICD-10-CM | POA: Diagnosis present

## 2022-11-01 IMAGING — CR DG HAND COMPLETE 3+V*L*
3 series · 3 of 3 positions shown · non-contrast
Comparison: None.

CLINICAL DATA: Burn to the palm of the left hand.

EXAM:
LEFT HAND - COMPLETE 3+ VIEW

[hand ap]
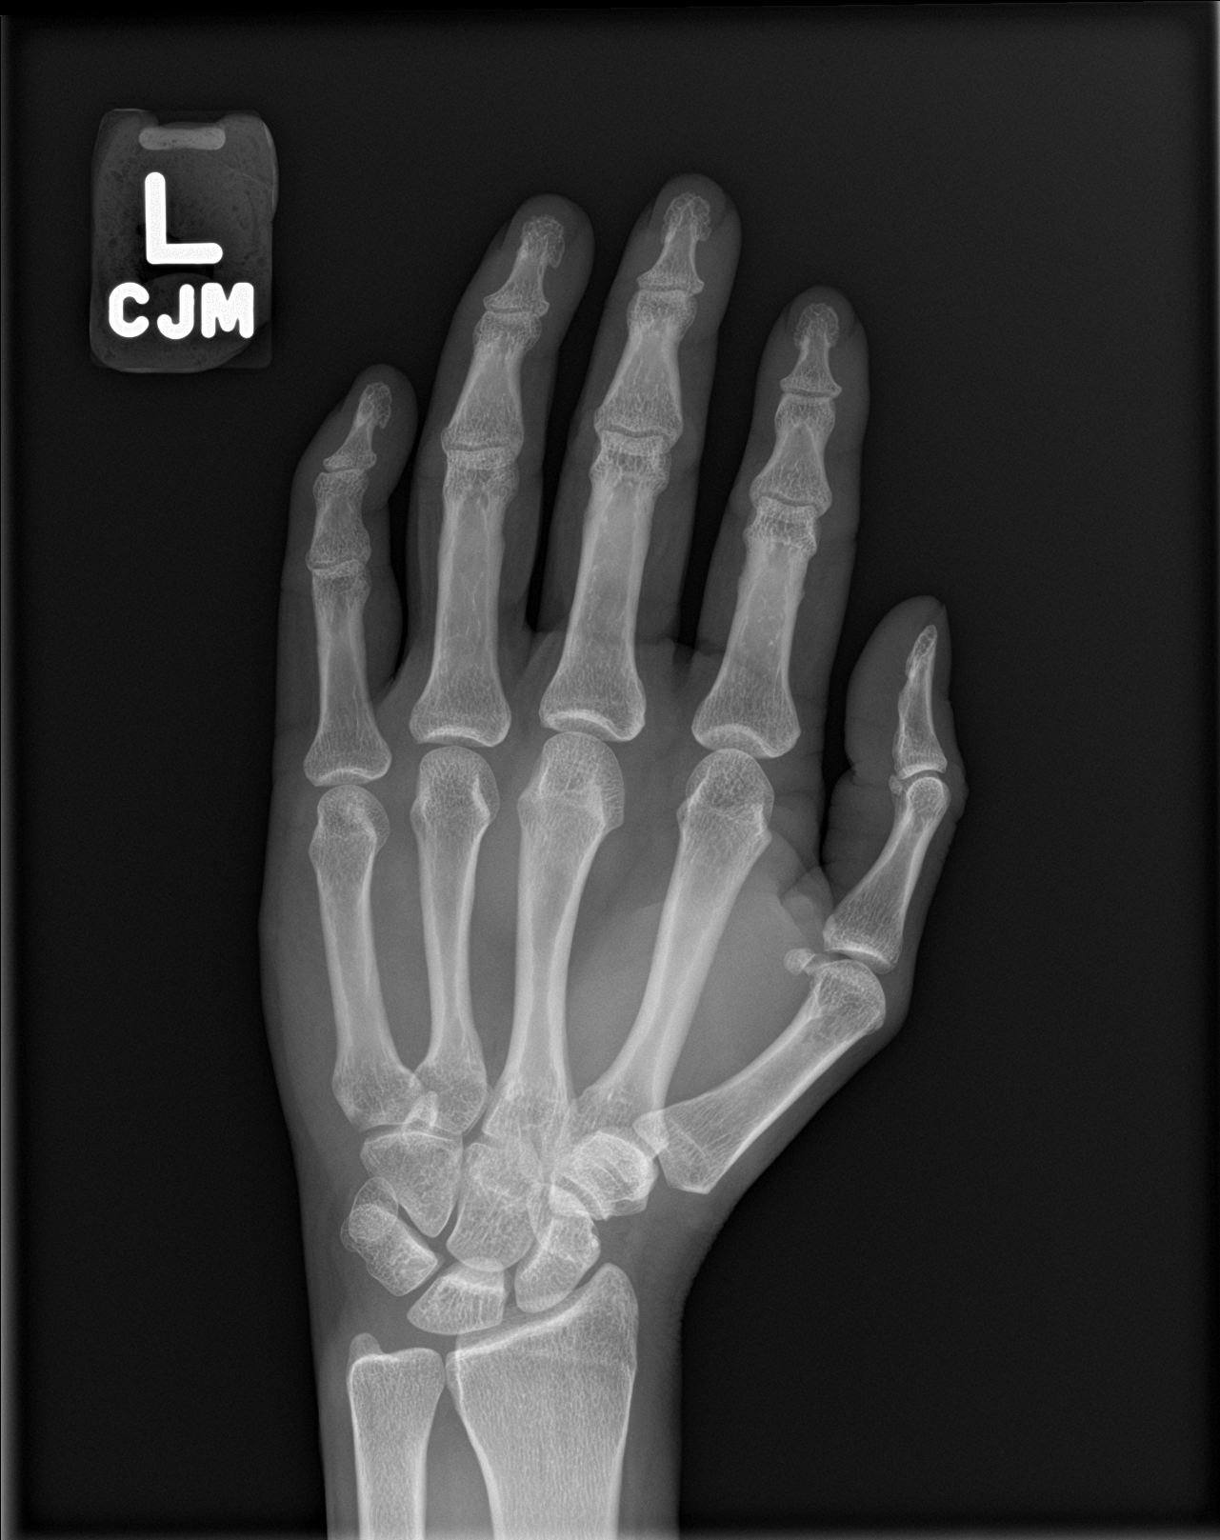

[hand obl]
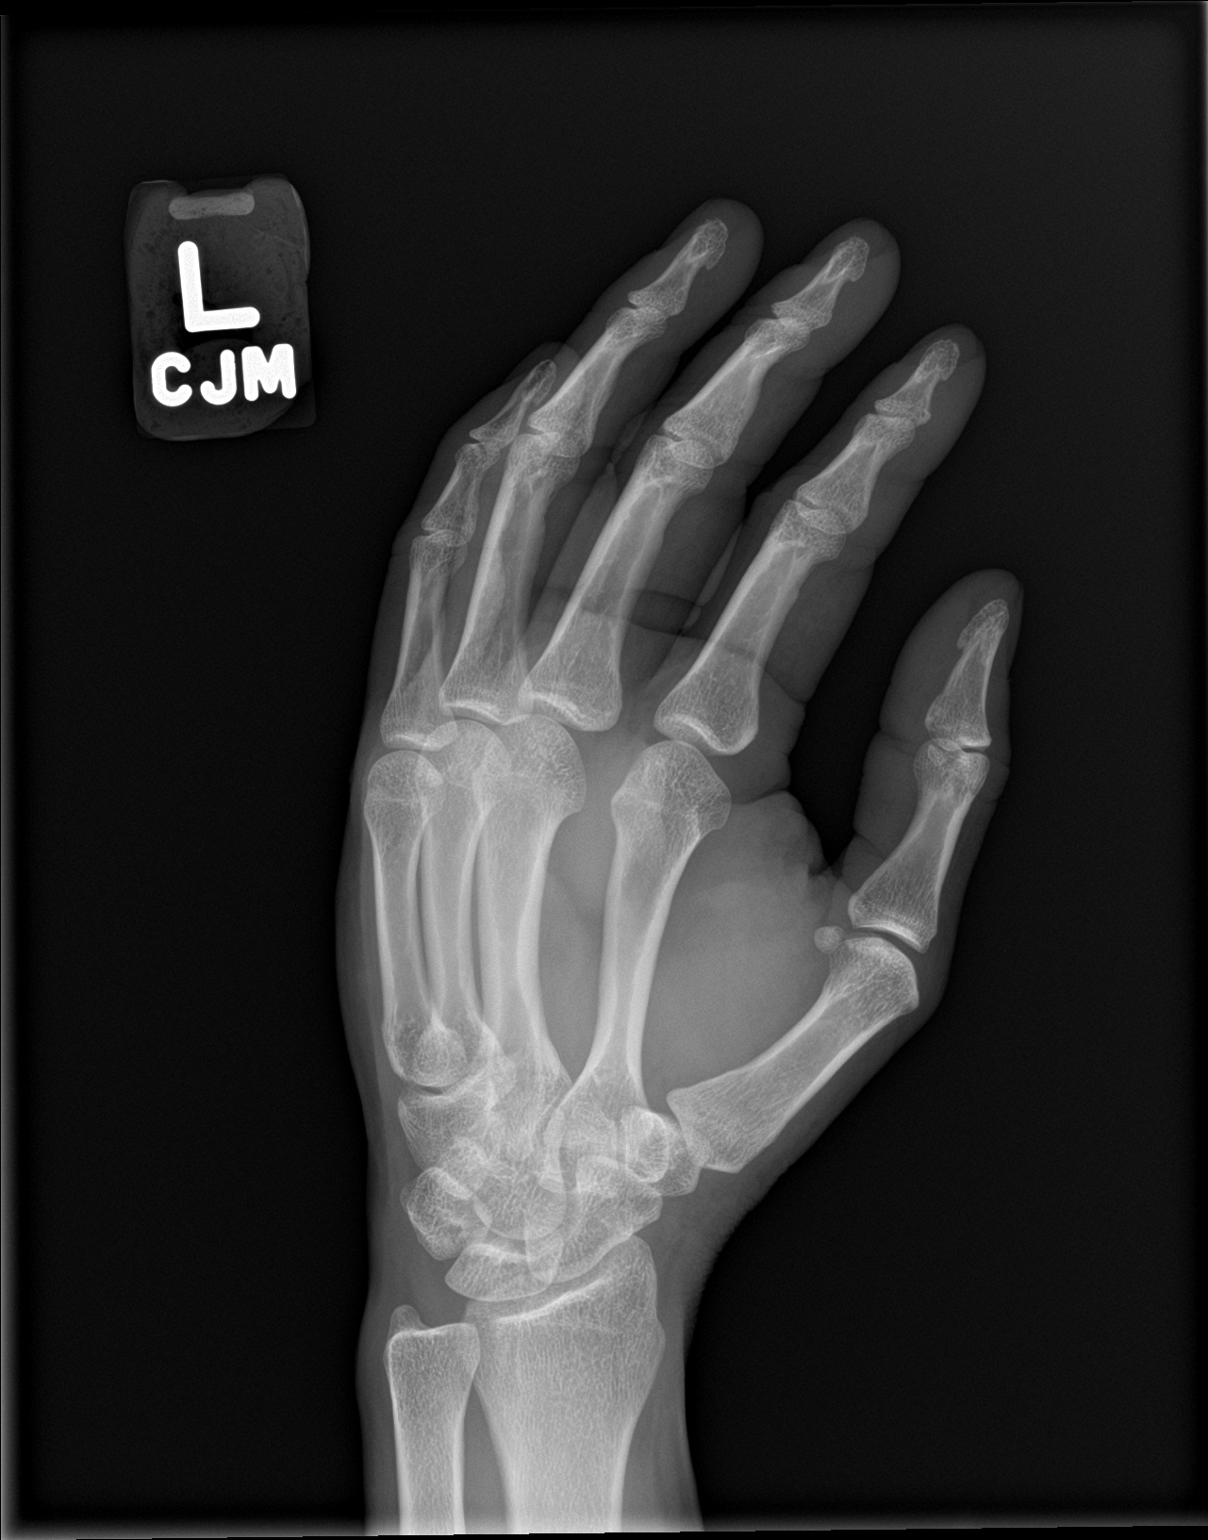

[hand lat]
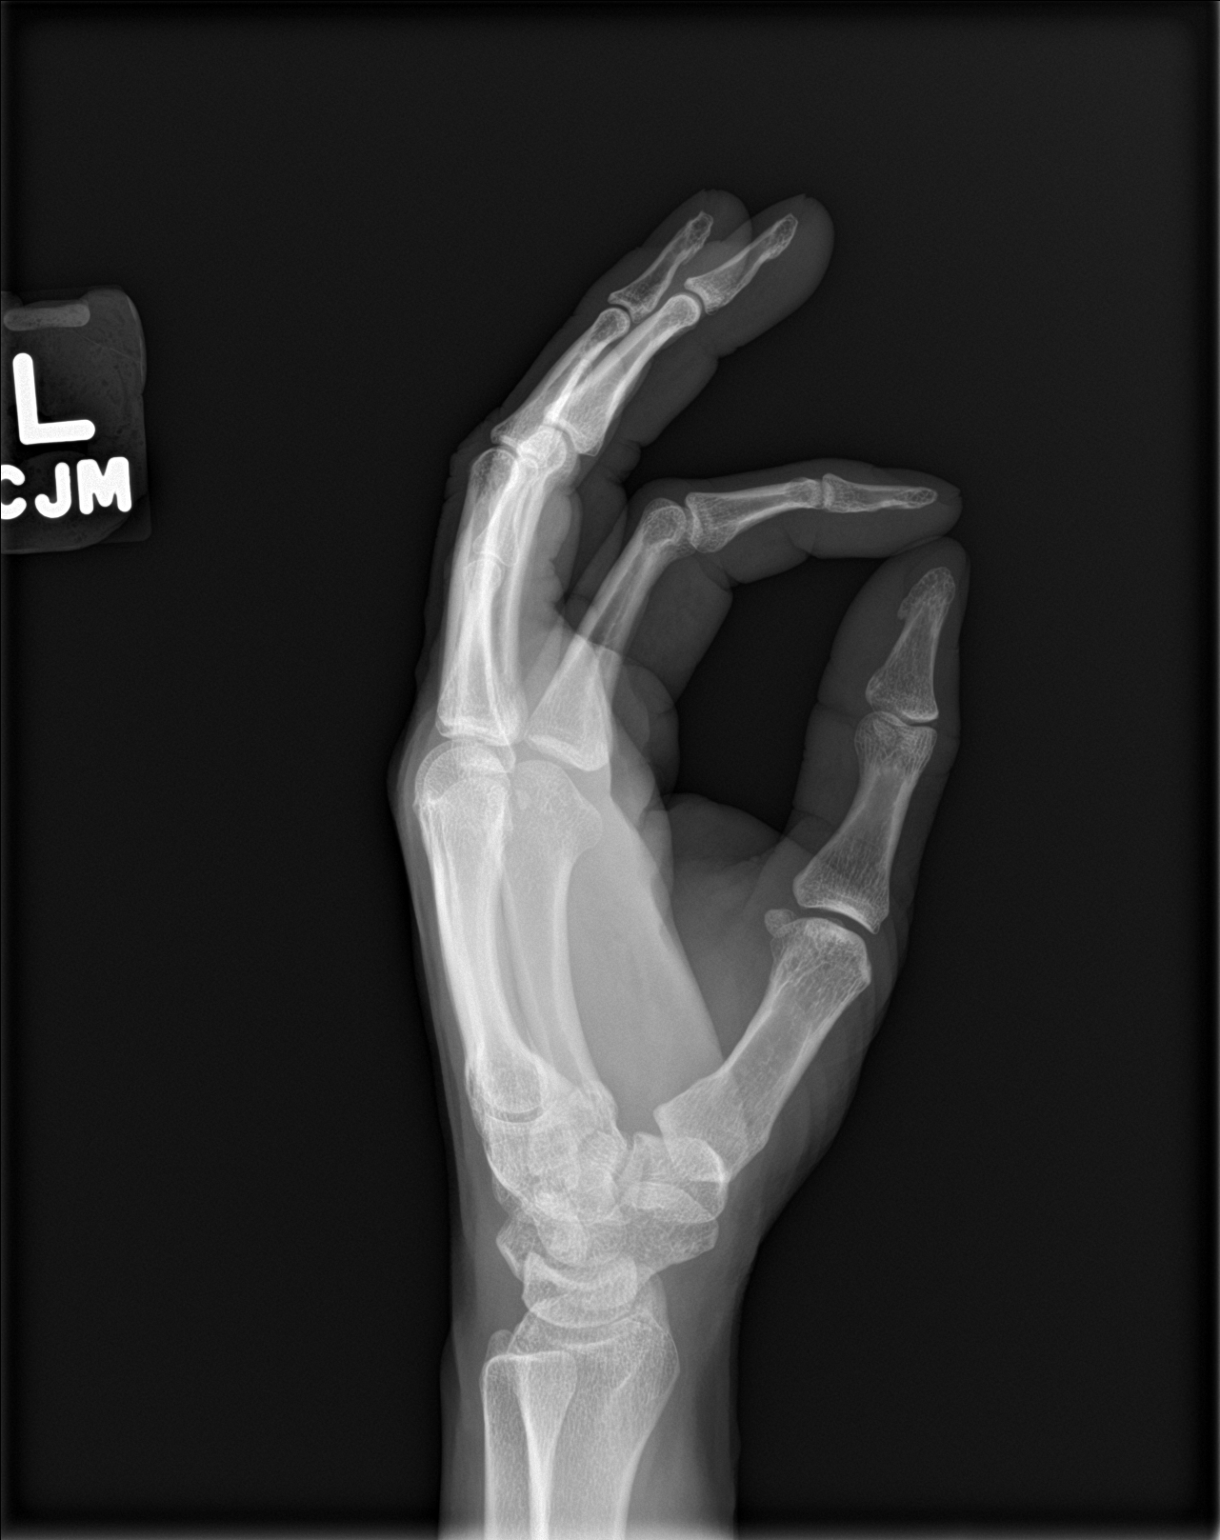

[3 of 3 positions shown; findings below may reference images not displayed]

FINDINGS: There is no evidence of fracture or dislocation. There is no
evidence of arthropathy or other focal bone abnormality. Soft
tissues are unremarkable.
IMPRESSION: Negative.

## 2023-01-15 DIAGNOSIS — M79669 Pain in unspecified lower leg: Secondary | ICD-10-CM | POA: Insufficient documentation

## 2023-01-15 NOTE — Progress Notes (Unsigned)
MRN : [id number]  Donna Mcdaniel is a 42 y.o. (05-09-1981) female who presents with chief complaint of legs hurt and swell.  History of Present Illness:   Patient is seen for evaluation of leg pain and leg swelling. The patient first noticed the swelling remotely. The swelling is associated with pain and discoloration. The pain and swelling worsens with prolonged dependency and improves with elevation. The pain is unrelated to activity.  The patient notes that in the morning the legs are significantly improved but they steadily worsened throughout the course of the day. The patient also notes a steady worsening of the discoloration in the ankle and shin area.   The patient denies claudication symptoms.  The patient denies symptoms consistent with rest pain.  The patient denies and extensive history of DJD and LS spine disease.  The patient has no had any past angiography, interventions or vascular surgery.  Elevation makes the leg symptoms better, dependency makes them much worse. There is no history of ulcerations. The patient denies any recent changes in medications.  The patient has not been wearing graduated compression.  The patient denies a history of DVT or PE. There is no prior history of phlebitis. There is no history of primary lymphedema.  No history of malignancies. No history of trauma or groin or pelvic surgery. There is no history of radiation treatment to the groin or pelvis  The patient denies amaurosis fugax or recent TIA symptoms. There are no recent neurological changes noted. The patient denies recent episodes of angina or shortness of breath  No outpatient medications have been marked as taking for the 01/16/23 encounter (Appointment) with Gilda Crease, Latina Craver, MD.    Past Medical History:  Diagnosis Date   Pre-diabetes     Past Surgical History:  Procedure Laterality Date   INGUINAL HERNIA REPAIR Right 05/24/2019   Procedure:  LAPAROSCOPIC INGUINAL HERNIA RIGHT;  Surgeon: Carolan Shiver, MD;  Location: [facility] ORS;  Service: General;  Laterality: Right;   TUBAL LIGATION      Social History Social History   Tobacco Use   Smoking status: Never   Smokeless tobacco: Never  Vaping Use   Vaping Use: Never used  Substance Use Topics   Alcohol use: Never   Drug use: Never    Family History No family history on file.  No Known Allergies   REVIEW OF SYSTEMS (Negative unless checked)  Constitutional: Weight loss  Fever  Chills Cardiac: Chest pain   Chest pressure   Palpitations   Shortness of breath when laying flat   Shortness of breath with exertion. Vascular:  Pain in legs with walking   Pain in legs at rest  History of DVT   Phlebitis   Swelling in legs   Varicose veins   Non-healing ulcers Pulmonary:   Uses home oxygen   Productive cough   Hemoptysis   Wheeze  COPD   Asthma Neurologic:  Dizziness   Seizures   History of stroke   History of TIA  Aphasia   Vissual changes   Weakness or numbness in arm   Weakness or numbness in leg Musculoskeletal:   Joint swelling   Joint pain   Low back pain Hematologic:  Easy bruising  Easy bleeding   Hypercoagulable state   Anemic Gastrointestinal:  Diarrhea   Vomiting  Gastroesophageal reflux/heartburn   Difficulty swallowing. Genitourinary:  Chronic kidney disease   Difficult urination  Frequent urination     Blood in urine Skin:  Rashes   Ulcers  Psychological:  History of anxiety    History of major depression.  Physical Examination  There were no vitals filed for this visit. There is no height or weight on file to calculate BMI. Gen: WD/WN, NAD Head: [place]/AT, No temporalis wasting.  Ear/Nose/Throat: Hearing grossly intact, nares w/o erythema or drainage, pinna without lesions Eyes: PER, EOMI, sclera nonicteric.  Neck: Supple, no gross masses.  No JVD.  Pulmonary:   Good air movement, no audible wheezing, no use of accessory muscles.  Cardiac: RRR, precordium not hyperdynamic. Vascular:  scattered varicosities present bilaterally.  Moderate venous stasis changes to the legs bilaterally.  2+ soft pitting edema. CEAP C4sEpAsPr   Vessel Right Left  Radial Palpable Palpable  Gastrointestinal: soft, non-distended. No guarding/no peritoneal signs.  Musculoskeletal: M/S 5/5 throughout.  No deformity.  Neurologic: CN 2-12 intact. Pain and light touch intact in extremities.  Symmetrical.  Speech is fluent. Motor exam as listed above. Psychiatric: Judgment intact, Mood & affect appropriate for pt's clinical situation. Dermatologic: Venous rashes no ulcers noted.  No changes consistent with cellulitis. Lymph : No lichenification or skin changes of chronic lymphedema.  CBC No results found for: "WBC", "HGB", "HCT", "MCV", "PLT"  BMET No results found for: "NA", "K", "CL", "CO2", "GLUCOSE", "BUN", "CREATININE", "CALCIUM", "GFRNONAA", "GFRAA" CrCl cannot be calculated (No successful lab value found.).  COAG No results found for: "INR", "PROTIME"  Radiology No results found.   Assessment/Plan There are no diagnoses linked to this encounter.   Levora Dredge, MD  01/15/2023 10:43 AM

## 2023-01-16 ENCOUNTER — Encounter (INDEPENDENT_AMBULATORY_CARE_PROVIDER_SITE_OTHER): Payer: Self-pay | Admitting: Vascular Surgery

## 2023-01-16 ENCOUNTER — Ambulatory Visit (INDEPENDENT_AMBULATORY_CARE_PROVIDER_SITE_OTHER): Payer: No Typology Code available for payment source | Admitting: Vascular Surgery

## 2023-01-16 VITALS — BP 137/88 | HR 54 | Resp 17 | Ht 63.0 in | Wt 152.0 lb

## 2023-01-16 DIAGNOSIS — M79606 Pain in leg, unspecified: Secondary | ICD-10-CM | POA: Insufficient documentation

## 2023-01-16 DIAGNOSIS — M79604 Pain in right leg: Secondary | ICD-10-CM

## 2023-01-16 DIAGNOSIS — M79669 Pain in unspecified lower leg: Secondary | ICD-10-CM

## 2023-01-20 ENCOUNTER — Encounter (INDEPENDENT_AMBULATORY_CARE_PROVIDER_SITE_OTHER): Payer: Self-pay | Admitting: Nurse Practitioner

## 2023-01-20 ENCOUNTER — Ambulatory Visit (INDEPENDENT_AMBULATORY_CARE_PROVIDER_SITE_OTHER): Payer: 59 | Admitting: Nurse Practitioner

## 2023-01-20 ENCOUNTER — Ambulatory Visit (INDEPENDENT_AMBULATORY_CARE_PROVIDER_SITE_OTHER): Payer: 59

## 2023-01-20 VITALS — BP 124/79 | HR 64 | Resp 16 | Wt 150.6 lb

## 2023-01-20 DIAGNOSIS — M79604 Pain in right leg: Secondary | ICD-10-CM

## 2023-02-12 ENCOUNTER — Encounter (INDEPENDENT_AMBULATORY_CARE_PROVIDER_SITE_OTHER): Payer: Self-pay | Admitting: Nurse Practitioner

## 2023-02-12 NOTE — Progress Notes (Signed)
Subjective:    Patient ID: Donna Mcdaniel, female    DOB: 12-Feb-1981, 42 y.o.   MRN: [id number] Chief Complaint  Patient presents with   Follow-up    Ultrasound follow up    The patient is seen for evaluation of painful lower extremities and it is merrily located in the right leg.  She notes that with standing it becomes worse and walking actually helps the pain to go away.  This has been ongoing for 9 to 10 months.  Start with buttock pain and now it seems to start her lower and go up her leg.  The patient has a  history of back problems and DJD of the lumbar and sacral spine.   The patient denies rest pain or dangling of an extremity off the side of the bed during the night for relief. No open wounds or sores at this time. No history of DVT or phlebitis. No prior vascular interventions or surgeries.  She has been evaluated by [facility].  They to suspect that this was related to her LS spine and probable degenerative changes.  An MRI was performed which did show a slight or mild bulging disc and she was told that this likely is not the cause.  The patient has triphasic waveforms located throughout the right lower extremity.  No evidence of any significant obstruction noted in the right lower extremity.    Review of Systems  Musculoskeletal:  Positive for arthralgias and back pain.  All other systems reviewed and are negative.      Objective:   Physical Exam Vitals reviewed.  HENT:     Head: Normocephalic.  Cardiovascular:     Rate and Rhythm: Normal rate.     Pulses:          Dorsalis pedis pulses are 2+ on the right side and 2+ on the left side.       Posterior tibial pulses are 2+ on the right side and 2+ on the left side.  Pulmonary:     Effort: Pulmonary effort is normal.  Skin:    General: Skin is warm and dry.  Neurological:     Mental Status: She is alert and oriented to person, place, and time.  Psychiatric:        Mood and Affect: Mood normal.         Behavior: Behavior normal.        Thought Content: Thought content normal.        Judgment: Judgment normal.     BP 124/79 (BP Location: Right Arm)   Pulse 64   Resp 16   Wt 150 lb 9.6 oz (68.3 kg)   BMI 26.68 kg/m   Past Medical History:  Diagnosis Date   Pre-diabetes     Social History   Socioeconomic History   Marital status: Married    Spouse name: Not on file   Number of children: Not on file   Years of education: Not on file   Highest education level: Not on file  Occupational History   Not on file  Tobacco Use   Smoking status: Never   Smokeless tobacco: Never  Vaping Use   Vaping Use: Never used  Substance and Sexual Activity   Alcohol use: Never   Drug use: Never   Sexual activity: Not on file  Other Topics Concern   Not on file  Social History Narrative   Not on file   Social Determinants of Health   Financial Resource  Strain: Not on file  Food Insecurity: Not on file  Transportation Needs: Not on file  Physical Activity: Not on file  Stress: Not on file  Social Connections: Not on file  Intimate Partner Violence: Not on file    Past Surgical History:  Procedure Laterality Date   INGUINAL HERNIA REPAIR Right 05/24/2019   Procedure: LAPAROSCOPIC INGUINAL HERNIA RIGHT;  Surgeon: Carolan Shiver, MD;  Location: [facility] ORS;  Service: General;  Laterality: Right;   TUBAL LIGATION      History reviewed. No pertinent family history.  No Known Allergies      No data to display            CMP  No results found for: "NA", "K", "CL", "CO2", "GLUCOSE", "BUN", "CREATININE", "CALCIUM", "PROT", "ALBUMIN", "AST", "ALT", "ALKPHOS", "BILITOT", "GFRNONAA", "GFRAA"   No results found.     Assessment & Plan:   1. Pain of right lower extremity Recommend:  The patient has atypical pain symptoms for vascular disease and on exam I do not find evidence of vascular pathology that would explain the patient's symptoms.  Noninvasive studies  do not identify significant vascular problems  I suspect the patient is c/o pseudoclaudication.  Patient should have an evaluation of the LS spine which I defer to the primary service or the Spine service.  The patient should continue walking and begin a more formal exercise program.   Patient will follow-up with me on a PRN basis.   Current Outpatient Medications on File Prior to Visit  Medication Sig Dispense Refill   cyanocobalamin (VITAMIN B12) 500 MCG tablet Take 500 mcg by mouth daily.     lanolin/mineral oil (KERI/THERA-DERM) LOTN Apply topically.     Lidocaine 3 % CREA Apply 1 application topically 2 (two) times daily as needed (pain). (Patient not taking: Reported on 01/16/2023) 30 g 0   magnesium gluconate (MAGONATE) 500 MG tablet Take 500 mg by mouth 2 (two) times daily.     Omega-3 Fatty Acids (FISH OIL) 300 MG CAPS Take by mouth.     Oxybenzone POWD Apply topically.     oxyCODONE-acetaminophen (PERCOCET/ROXICET) 5-325 MG tablet Take 1 tablet by mouth every 6 (six) hours as needed for severe pain. (Patient not taking: Reported on 01/16/2023) 20 tablet 0   silver sulfADIAZINE (SILVADENE) 1 % cream Apply to affected area daily (Patient not taking: Reported on 01/16/2023) 50 g 0   No current facility-administered medications on file prior to visit.    There are no Patient Instructions on file for this visit. No follow-ups on file.   Georgiana Spinner, NP

## 2023-05-23 ENCOUNTER — Other Ambulatory Visit: Payer: Self-pay

## 2023-05-23 ENCOUNTER — Ambulatory Visit: Payer: 59 | Attending: Nurse Practitioner | Admitting: Physical Therapy

## 2023-05-23 DIAGNOSIS — M6281 Muscle weakness (generalized): Secondary | ICD-10-CM | POA: Diagnosis present

## 2023-05-23 DIAGNOSIS — R293 Abnormal posture: Secondary | ICD-10-CM | POA: Insufficient documentation

## 2023-05-23 DIAGNOSIS — G8929 Other chronic pain: Secondary | ICD-10-CM | POA: Insufficient documentation

## 2023-05-23 DIAGNOSIS — M5441 Lumbago with sciatica, right side: Secondary | ICD-10-CM | POA: Diagnosis present

## 2023-05-23 DIAGNOSIS — Z1231 Encounter for screening mammogram for malignant neoplasm of breast: Secondary | ICD-10-CM

## 2023-05-23 NOTE — Therapy (Addendum)
OUTPATIENT PHYSICAL THERAPY THORACOLUMBAR EVALUATION/ DISCHARGE NOTE PHYSICAL THERAPY DISCHARGE SUMMARY  Visits from Start of Care: 1  Current functional level related to goals / functional outcomes: UNKNOWN   Remaining deficits: UNKNOWN   Education / Equipment: INITIAL HEP   Patient agrees to discharge. Patient goals were not met. Patient is being discharged due to not returning since the last visit.   Pt had appt on the  schedule   Pt did had 3 NO SHOWS and due to attendance policy as been discharged Attendance Policy   2 no shows or late cancellations will be grounds for discharge and will require a NEW REFERRAL from your provider.  Patient Name: Donna Mcdaniel MRN: [id number] DOB:1981-07-16, 42 y.o., female Today's Date: 05/23/2023  END OF SESSION:  PT End of Session - 05/23/23 1417     Visit Number 1    Number of Visits 12    Date for PT Re-Evaluation 07/06/23    Authorization Type [employer]    PT Start Time 1115    PT Stop Time 1200    PT Time Calculation (min) 45 min    Activity Tolerance Patient tolerated treatment well;Patient limited by pain    Behavior During Therapy [facility] for tasks assessed/performed             Past Medical History:  Diagnosis Date   Pre-diabetes    Past Surgical History:  Procedure Laterality Date   INGUINAL HERNIA REPAIR Right 05/24/2019   Procedure: LAPAROSCOPIC INGUINAL HERNIA RIGHT;  Surgeon: Carolan Shiver, MD;  Location: [facility] ORS;  Service: General;  Laterality: Right;   TUBAL LIGATION     Patient Active Problem List   Diagnosis Date Noted   Leg pain 01/16/2023    PCP: [facility]   REFERRING PROVIDER: Carmel Sacramento, NP   REFERRING DIAG: M54.50 (ICD-10-CM) - Low back pain, unspecified   Rationale for Evaluation and Treatment: Rehabilitation  THERAPY DIAG:  Chronic bilateral low back pain with right-sided sciatica - Plan: PT plan of care cert/re-cert  Muscle weakness (generalized) - Plan: PT  plan of care cert/re-cert  Abnormal posture - Plan: PT plan of care cert/re-cert  ONSET DATE: February 2024 Work fall  SUBJECTIVE:                                                                                                                                                                                           SUBJECTIVE STATEMENT: I feel numbness  in my toes when you press into my back ( QL)  I have numbness in my great toe and 2nd toe on R side LE.  Down back of  leg pain down back of leg into R calf.  Sometime bothers my sleep. I had an accident at work and slipped and fell on oil on floor at work at [employer] February 2024.  I have had pain since then. Sleep has been decreased since incident to 2-3 hours a night   PERTINENT HISTORY:  Inguinal Hernia repair 2020, leg pain  PAIN:  Are you having pain? Yes: NPRS scale: at rest 3/4 out of/10 at worst pain 06/05/09 Pain location: low back radiating down R leg Pain description: burning pain in my back and down my leg. Aggravating factors: standing for longer than 20 minutes no pain in my leg when I am sitting only standing. If I am walking I have no pain but standing is worse Relieving factors: I try to exercise but I dont' know what to do Nothing relieves it PRECAUTIONS: None  RED FLAGS: None   WEIGHT BEARING RESTRICTIONS: No  FALLS:  Has patient fallen in last 6 months? Yes. Number of falls once at work when I hurt my back  LIVING ENVIRONMENT: Lives with: lives with their family Lives in: House/apartment Stairs: Yes: External: 3 steps; none Has following equipment at home: None  OCCUPATION: Psychiatric nurse  PLOF: Independent  PATIENT GOALS: To get rid of back pain and feel better  NEXT MD VISIT: TBD  OBJECTIVE:   DIAGNOSTIC FINDINGS:  None recent  PATIENT SURVEYS:  Modified Oswestry 36%   SCREENING FOR RED FLAGS: Bowel or bladder incontinence: No  COGNITION: Overall cognitive status: Within  functional limits for tasks assessed     SENSATION: Sensation intact but complaint of Right Great toe and 2nd toe  numbness  MUSCLE LENGTH: Hamstrings: Right 65 deg; Left 67 deg  POSTURE: rounded shoulders, forward head, and Right pelvic level elevated  tightened QL  PALPATION: TTP over RT QL and over L4/5 spinous process with palpation.  And RT SI with compression  LUMBAR ROM:   AROM eval  Flexion Finger tips to ankle bil  Extension 50%  Right lateral flexion 80%  Left lateral flexion 80%  Right rotation 50%  Left rotation 50%   (Blank rows = not tested)  LOWER EXTREMITY ROM:   WNL for all motions except those indication  Active  Right eval Left eval  Hip flexion Standing 70 pain limiting 110  Hip extension    Hip abduction    Hip adduction    Hip internal rotation    Hip external rotation    Knee flexion    Knee extension    Ankle dorsiflexion    Ankle plantarflexion    Ankle inversion    Ankle eversion     (Blank rows = not tested)  LOWER EXTREMITY MMT:    MMT Right eval Left eval  Hip flexion 4 4+  Hip extension 4 4+  Hip abduction 4- 4  Hip adduction    Hip internal rotation    Hip external rotation    Knee flexion    Knee extension 5 5  Ankle dorsiflexion 5 5  Ankle plantarflexion    Ankle inversion    Ankle eversion     (Blank rows = not tested)  LUMBAR SPECIAL TESTS:  Straight leg raise test: Negative, Slump test: Negative, and Quadrant test: Negative FADIR neg FABER neg  FUNCTIONAL TESTS:  5 times sit to stand: 8.26  GAIT: Distance walked: 150 Assistive device utilized: None Level of assistance: Complete Independence Comments: Pt with no difficulty walking  TODAY'S  TREATMENT:                                                                                                                              DATE: EVAL 05-23-23  Pt with issue of HEP and Manual with lumbar thoracic thrust with cavitation and reduction in symptoms   PATIENT  EDUCATION:  Education details: POC  Oswestry, Explanation of findings through interpreter issue HEP Person educated: Patient and Child(ren) Education method: Explanation, Demonstration, Tactile cues, Verbal cues, and Handouts Education comprehension: verbalized understanding, returned demonstration, verbal cues required, tactile cues required, and needs further education  HOME EXERCISE PROGRAM: Access Code: [email or web address] URL: [email or web address] Date: 05/23/2023 Prepared by: Garen Lah  Exercises - Supine Pelvic Tilt  - 1 x daily - 7 x weekly - 3 sets - 10 reps - Supine Single Knee to Chest Stretch  - 2 x daily - 7 x weekly - 1 sets - 5 reps - 10 hold - Supine Lower Trunk Rotation  - 2 x daily - 7 x weekly - 1 sets - 5 reps - 20 hold - Supine Bridge  - 1 x daily - 7 x weekly - 2 sets - 10 reps - Supine Hamstring Stretch with Strap  - 2 x daily - 7 x weekly - 1 sets - 3 reps - 20-16msec hold  ASSESSMENT:  CLINICAL IMPRESSION: Patient is a 42 y.o. female who was seen today for physical therapy evaluation and treatment for chronic low back pain with radiating pain into R calf and numbess over Great toe/2nd toe. Presents to clinic with 4/10 pain and complains of pain as high as 9/10. Pt. Complains of pain standing longer than 20-30 minutes.  Ms Ardyth Harps fell at work on oily floor at her work and has had pain since February 2024. Oswestry Modified at 36%. Pt has developed fear of movement since fall at work to prevent pain and will benefit from skilled PT to address impairments and restore to active lifestyle.   OBJECTIVE IMPAIRMENTS: decreased activity tolerance, decreased ROM, decreased strength, impaired perceived functional ability, impaired sensation, improper body mechanics, postural dysfunction, and pain.   ACTIVITY LIMITATIONS: bending, sitting, standing, squatting, sleeping, stairs, and caring for others  PARTICIPATION LIMITATIONS: meal prep, cleaning, laundry,  shopping, and occupation  PERSONAL FACTORS: Inguinal Hernia repair 2020, leg pain are also affecting patient's functional outcome.   REHAB POTENTIAL: Good  CLINICAL DECISION MAKING: Evolving/moderate complexity  EVALUATION COMPLEXITY: Moderate   GOALS: Goals reviewed with patient? Yes  SHORT TERM GOALS: Target date: 06-13-23  Pt will be I with initial HEP Baseline:no knowledge Goal status: INITIAL  2.  Pt will reduce pain with functional activity form 9/10 to 5/10 Baseline: at worst 9/10,  Goal status: INITIAL  3.  Pt will be able to negotiate steps without exacerbating pain Baseline: avoids steps Goal status: INITIAL    LONG TERM GOALS: Target date: 07-06-23  Pt will be independent with advanced HEP Baseline:  Goal status: INITIAL  2.  Pt will improve Oswestry from 36% to 18 % to show improved functional mobility Baseline: eval 36% Goal status: INITIAL  3.  Pt will be able to sleep 5 or more hour of uninterrupted restorative sleep in order to maximally heal Baseline: waking off and on at night every 2-3 hours Goal status: INITIAL  4. Ms Ardyth Harps will be able to lift 25# from the floor, not limited by pain, to allow completion of housework and potential job duties Baseline: unable to lift weight due to pain Goal status: INITIAL  5.  Demonstrate 4+/5 RT LE strength to improve stability, safety and endurance of community level function Baseline: See MMT chart Goal status: INITIAL  6.  Pt will begin routine walking and exercise program at least 3 x a week or prepare to join gym to continue maximizing strength potential Baseline: not currently exercising due to being limited by pain Goal status: INITIAL  PLAN:  PT FREQUENCY: 1-2x/week  PT DURATION: 6 weeks  PLANNED INTERVENTIONS: Therapeutic exercises, Therapeutic activity, Neuromuscular re-education, Balance training, Gait training, Patient/Family education, Self Care, Joint mobilization, Stair training, Dry  Needling, Spinal mobilization, Cryotherapy, Moist heat, Taping, Manual therapy, and Re-evaluation.  PLAN FOR NEXT SESSION: Manual. Check R SI,  Needs good basic back program and decrease fear of movement with HEP  Garen Lah, PT, [facility] Certified Exercise Expert for the Aging Adult  05/23/23 2:47 PM Phone: [phone] Fax: [phone]   Garen Lah, PT, [facility] Certified Exercise Expert for the Aging Adult  06/20/23 5:04 PM Phone: [phone] Fax: [phone]

## 2023-06-08 ENCOUNTER — Ambulatory Visit: Payer: 59 | Attending: Nurse Practitioner

## 2023-06-14 ENCOUNTER — Ambulatory Visit: Payer: 59

## 2023-06-14 ENCOUNTER — Telehealth: Payer: Self-pay

## 2023-06-14 NOTE — Telephone Encounter (Signed)
Patient has NS two appointments, unable to contact via phone or MyChart. Will be DC from PT if she NS one more appointment.  Donna Mcdaniel

## 2023-06-14 NOTE — Telephone Encounter (Signed)
Called patient because she no showed PT appt with stephanie, no answer, no voicemail set up.

## 2023-06-20 ENCOUNTER — Telehealth: Payer: Self-pay | Admitting: Physical Therapy

## 2023-06-20 ENCOUNTER — Ambulatory Visit: Payer: 59 | Admitting: Physical Therapy

## 2023-06-20 NOTE — Telephone Encounter (Signed)
Unable to leave message through [employer] interpreter services withEVA # [phone].  Donna Mcdaniel did not show for 3rd appt and will be discharged from PT services.   She will require a new referral to continue appt.  Attempted to contact and leave messages at work and mobile number would not allow leaving of messages.    Will discharge due to attendance policy  Garen Lah, PT, [facility] Certified Exercise Expert for the Aging Adult  06/20/23 4:37 PM Phone: [phone] Fax: [phone]

## 2023-06-27 ENCOUNTER — Ambulatory Visit: Payer: 59
